# Patient Record
Sex: Female | Born: 1975 | Race: Black or African American | Hispanic: No | Marital: Married | Smoking: Never smoker
Health system: Southern US, Community
[De-identification: ages and names within clinical notes are randomized; demographics above are authoritative.]

## PROBLEM LIST (undated history)

## (undated) DIAGNOSIS — I10 Essential (primary) hypertension: Secondary | ICD-10-CM

## (undated) HISTORY — PX: BREAST REDUCTION SURGERY: SHX8

---

## 2021-02-20 ENCOUNTER — Emergency Department (HOSPITAL_COMMUNITY)
Admission: EM | Admit: 2021-02-20 | Discharge: 2021-02-20 | Disposition: A | Discharge status: Home / Self Care | Payer: BLUE CROSS/BLUE SHIELD | Attending: Emergency Medicine | Admitting: Emergency Medicine

## 2021-02-20 ENCOUNTER — Other Ambulatory Visit: Payer: Self-pay

## 2021-02-20 ENCOUNTER — Encounter (HOSPITAL_COMMUNITY): Payer: Self-pay | Admitting: Emergency Medicine

## 2021-02-20 DIAGNOSIS — I10 Essential (primary) hypertension: Secondary | ICD-10-CM | POA: Insufficient documentation

## 2021-02-20 DIAGNOSIS — R519 Headache, unspecified: Secondary | ICD-10-CM | POA: Diagnosis not present

## 2021-02-20 HISTORY — DX: Essential (primary) hypertension: I10

## 2021-02-20 MED ORDER — KETOROLAC TROMETHAMINE 30 MG/ML IJ SOLN
30.0000 mg | Freq: Once | INTRAMUSCULAR | Status: AC
  Administered 2021-02-20: 30 mg via INTRAVENOUS
  Filled 2021-02-20: qty 1

## 2021-02-20 MED ORDER — PROCHLORPERAZINE EDISYLATE 10 MG/2ML IJ SOLN
10.0000 mg | Freq: Once | INTRAMUSCULAR | Status: AC
  Administered 2021-02-20: 10 mg via INTRAVENOUS
  Filled 2021-02-20: qty 2

## 2021-02-20 MED ORDER — SODIUM CHLORIDE 0.9 % IV BOLUS
500.0000 mL | Freq: Once | INTRAVENOUS | Status: AC
  Administered 2021-02-20: 500 mL via INTRAVENOUS

## 2021-02-20 MED ORDER — DIPHENHYDRAMINE HCL 50 MG/ML IJ SOLN
25.0000 mg | Freq: Once | INTRAMUSCULAR | Status: AC
  Administered 2021-02-20: 25 mg via INTRAVENOUS
  Filled 2021-02-20: qty 1

## 2021-02-20 NOTE — ED Provider Notes (Signed)
  Physical Exam  BP (!) 155/98   Pulse 60   Temp 98.3 F (36.8 C) (Oral)   Resp 20   Ht 5\' 5"  (1.651 m)   Wt 109.8 kg   SpO2 100%   BMI 40.27 kg/m   Physical Exam Vitals reviewed.  Constitutional:      Appearance: Normal appearance.  HENT:     Head: Normocephalic and atraumatic.  Eyes:     General:        Right eye: No discharge.        Left eye: No discharge.     Extraocular Movements: Extraocular movements intact.     Conjunctiva/sclera: Conjunctivae normal.  Pulmonary:     Effort: Pulmonary effort is normal.  Skin:    General: Skin is warm and dry.     Findings: No rash.  Neurological:     General: No focal deficit present.     Mental Status: She is alert.     Comments: Cranial nerves II to XII are intact.  5/5 strength in the upper and lower extremities.  Normal sensation to the upper and lower extremities.  Psychiatric:        Mood and Affect: Mood normal.        Behavior: Behavior normal.    ED Course/Procedures     Procedures  MDM   Accepted handoff at shift change from [facility], PA-C. Please see prior provider note for more detail.   Briefly: Patient is 45 y.o. who presents to the emergency department today for intractable headache.  Recently moved to the area and does not have a primary care provider.  DDX: Given the clinical course, have a low suspicion for acute intracranial pathology at this time.  This is likely a migraine.  She has a normal neurological exam on reevaluation.  Imaging is not warranted at this time.  She feels much better after the headache cocktail. This is not likely due to her HTN. She takes her medications as prescribed and has not missed any doses.   Plan: To discharge home.  I have given her primary care follow-up and will have her follow-up within the next week.  Strict return precautions were given.  Again patient feels much better and is stable for discharge          45 02/20/21 0817     02/22/21, MD 02/20/21 1145

## 2021-02-20 NOTE — Discharge Instructions (Signed)
Thank you for allowing me to care for you today in the Emergency Department.   Please call the number on your discharge paperwork to get established with a primary care provider since he just moved to the area.  Continue to take your home medications as prescribed.  Take 650 mg of Tylenol or 600 mg of ibuprofen with food every 6 hours for pain.  You can alternate between these 2 medications every 3 hours if your pain returns.  For instance, you can take Tylenol at noon, followed by a dose of ibuprofen at 3, followed by second dose of Tylenol and 6.  Make sure that you are drinking plenty of fluids.  Dehydration is the #1 cause of headaches.  Return to the emergency department if you have a severe headache with new numbness, weakness, visual changes, fever, unable to walk, have persistent vomiting, or other new, concerning symptoms.

## 2021-02-20 NOTE — ED Triage Notes (Signed)
Patient arrives complaining of a constant headache since Tuesday. Patient states hx of HTN, taking her medications as prescribed. Patient states no hx of headache, no vision changes, no N/V. Patient states BPs at home have been good.

## 2021-02-20 NOTE — ED Provider Notes (Signed)
[facility]-EMERGENCY DEPT Provider Note   CSN: [id number] Arrival date & time: 02/20/21  0503     History Chief Complaint  Patient presents with   Headache    Gina Wu is a 45 y.o. female with a history of hypertension who presents to the emergency department with a chief complaint of headache.  The patient reports a constant headache for the last 5 days.  Head is located in her mid-forehead and parietal scalp.  Pain is not radiating.  She characterizes the pain as throbbing.  Pain is worse if she bends over, but no other known aggravating or alleviating factors.  Pain has been waxing and waning in intensity since onset.  She does report a history of intermittent headaches over the years, but does state that this is different from previous headaches as it has lasted longer and has been more intense.  She denies fever, chills, neck pain or stiffness, diplopia, blurred vision, amaurosis fugax, nausea, vomiting, chest pain, shortness of breath, numbness, weakness, rash, dizziness, cough, lightheadedness, or URI symptoms.  She reports compliance with her home antihypertensives.  The patient just recently relocated to the area from [place].  No known sick contacts.  The history is provided by the patient and medical records. No language interpreter was used.      Past Medical History:  Diagnosis Date   Hypertension     There are no problems to display for this patient.   History reviewed. No pertinent surgical history.   OB History   No obstetric history on file.     No family history on file.  Social History   Tobacco Use   Smoking status: Never   Smokeless tobacco: Never  Substance Use Topics   Alcohol use: Not Currently   Drug use: Never    Home Medications Prior to Admission medications   Not on File    Allergies    Patient has no known allergies.  Review of Systems   Review of Systems  Constitutional:  Negative for  activity change, chills, fatigue and fever.  HENT:  Negative for congestion, sneezing, sore throat and voice change.   Eyes:  Negative for visual disturbance.  Respiratory:  Negative for cough, shortness of breath and wheezing.   Cardiovascular:  Negative for chest pain and palpitations.  Gastrointestinal:  Negative for abdominal pain, constipation, diarrhea, nausea and vomiting.  Genitourinary:  Negative for dysuria.  Musculoskeletal:  Negative for arthralgias, back pain, myalgias and neck stiffness.  Skin:  Negative for rash.  Allergic/Immunologic: Negative for immunocompromised state.  Neurological:  Positive for headaches. Negative for dizziness, seizures, syncope, weakness and numbness.  Psychiatric/Behavioral:  Negative for confusion.    Physical Exam Updated Vital Signs BP (!) 155/98   Pulse 60   Temp 98.3 F (36.8 C) (Oral)   Resp 20   Ht 5\' 5"  (1.651 m)   Wt 109.8 kg   SpO2 100%   BMI 40.27 kg/m   Physical Exam Vitals and nursing note reviewed.  Constitutional:      General: She is not in acute distress.    Appearance: She is not ill-appearing, toxic-appearing or diaphoretic.  HENT:     Head: Normocephalic.  Eyes:     Conjunctiva/sclera: Conjunctivae normal.  Cardiovascular:     Rate and Rhythm: Normal rate and regular rhythm.     Pulses: Normal pulses.     Heart sounds: Normal heart sounds. No murmur heard.   No friction rub. No gallop.  Pulmonary:     Effort: Pulmonary effort is normal. No respiratory distress.     Breath sounds: No stridor. No wheezing, rhonchi or rales.  Chest:     Chest wall: No tenderness.  Abdominal:     General: There is no distension.     Palpations: Abdomen is soft. There is no mass.     Tenderness: There is no abdominal tenderness. There is no right CVA tenderness, left CVA tenderness, guarding or rebound.     Hernia: No hernia is present.  Musculoskeletal:        General: No tenderness.     Cervical back: Neck supple.      Right lower leg: No edema.     Left lower leg: No edema.  Skin:    General: Skin is warm.     Capillary Refill: Capillary refill takes less than 2 seconds.     Findings: No rash.  Neurological:     Mental Status: She is alert.     Comments: Mental Status: Patient is awake, alert, oriented to person, place, month, year, and situation. Patient is able to give a clear and coherent history. Cranial Nerves: II: Visual Fields are full. Pupils are equal, round, and reactive to light. III,IV, VI: EOMI without ptosis or diploplia.  V: Facial sensation is symmetric to temperature VII: Facial movement is symmetric.  VIII: hearing is intact to voice X: Uvula elevates symmetrically XI: Shoulder shrug is symmetric. XII: tongue is midline without atrophy or fasciculations.  Motor: Tone is normal. Bulk is normal. 5/5 strength was present in all four extremities.  Sensory: Sensation is symmetric to light touch and temperature in the arms and legs. Cerebellar: FNF intact bilaterally    Psychiatric:        Behavior: Behavior normal.    ED Results / Procedures / Treatments   Labs (all labs ordered are listed, but only abnormal results are displayed) Labs Reviewed - No data to display  EKG None  Radiology No results found.  Procedures Procedures   Medications Ordered in ED Medications  prochlorperazine (COMPAZINE) injection 10 mg (has no administration in time range)  diphenhydrAMINE (BENADRYL) injection 25 mg (has no administration in time range)  sodium chloride 0.9 % bolus 500 mL (has no administration in time range)  ketorolac (TORADOL) 30 MG/ML injection 30 mg (has no administration in time range)    ED Course  I have reviewed the triage vital signs and the nursing notes.  Pertinent labs & imaging results that were available during my care of the patient were reviewed by me and considered in my medical decision making (see chart for details).    MDM Rules/Calculators/A&P                            45 year old female with a history of hypertension who presents the emergency department with a chief complaint of headache.  Patient has had a persistent headache for the last 5 days.  No other associated symptoms.  She has been compliant with her home antihypertensive agents.  Mild hypertension in the ED.  Vital signs are otherwise stable.  She has no focal neurologic deficits.  Low suspicion for [facility], [facility], meningitis, or temporal arteritis.  Patient care transferred to [facility] at the end of my shift. Patient presentation, ED course, and plan of care discussed with review of all pertinent labs and imaging. Please see his/her note for further details regarding further ED  course and disposition.  Final Clinical Impression(s) / ED Diagnoses Final diagnoses:  Bad headache    Rx / DC Orders ED Discharge Orders     None        Barkley Boards, PA-C 02/20/21 0658    Koleen Distance, MD 02/20/21 [id number]

## 2021-03-31 ENCOUNTER — Other Ambulatory Visit: Payer: Self-pay | Admitting: Family Medicine

## 2021-03-31 DIAGNOSIS — H4710 Unspecified papilledema: Secondary | ICD-10-CM

## 2021-03-31 DIAGNOSIS — G4452 New daily persistent headache (NDPH): Secondary | ICD-10-CM

## 2021-04-10 ENCOUNTER — Ambulatory Visit
Admission: RE | Admit: 2021-04-10 | Discharge: 2021-04-10 | Disposition: A | Discharge status: Home / Self Care | Payer: Self-pay | Source: Ambulatory Visit | Attending: Family Medicine | Admitting: Family Medicine

## 2021-04-10 DIAGNOSIS — G4452 New daily persistent headache (NDPH): Secondary | ICD-10-CM

## 2021-04-10 DIAGNOSIS — H4710 Unspecified papilledema: Secondary | ICD-10-CM

## 2021-04-10 IMAGING — MR MR HEAD W/O CM
11 series · 48 of 48 positions shown · non-contrast
Comparison: No pertinent prior exams available for comparison.

CLINICAL DATA: New daily persistent headache. Optic nerve swelling.
Additional history provided by scanning technologist: Patient
reports severe headaches for 2 months.

EXAM:
MRI HEAD WITHOUT CONTRAST
TECHNIQUE: Multiplanar, multiecho pulse sequences of the brain and surrounding
structures were obtained without intravenous contrast.

[Series 5: T1 · sagittal · 4.0mm · 0.75mm/px · 2 of 31 slices shown (1 of 2)]
[im 1/31]
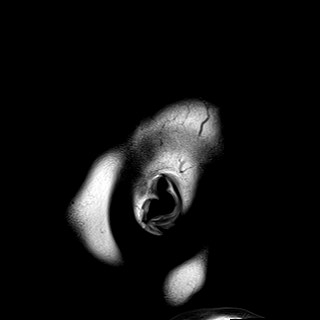
[im 31/31]
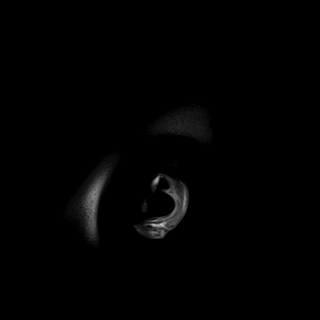

[Series 6: DWI · axial · 3.0mm · 0.94mm/px · z∈[-84,+56]mm · 10 of 160 slices shown (1 of 3)]
[im 1/160]
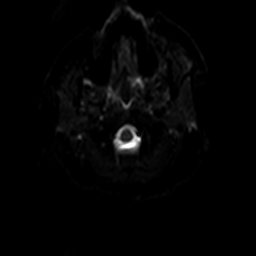
[im 18/160]
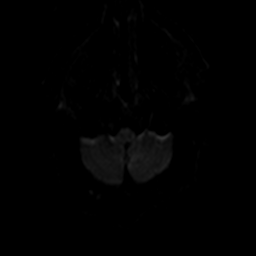
[im 36/160]
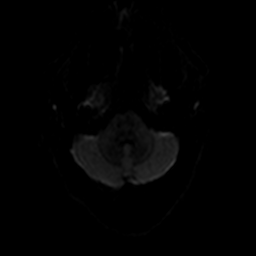
[im 54/160]
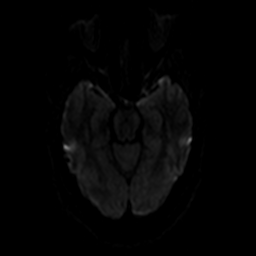
[im 71/160]
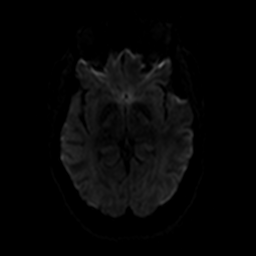
[im 89/160]
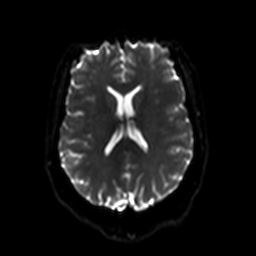
[im 107/160]
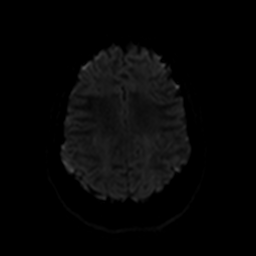
[im 124/160]
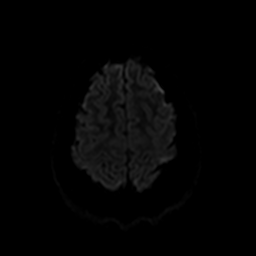
[im 142/160]
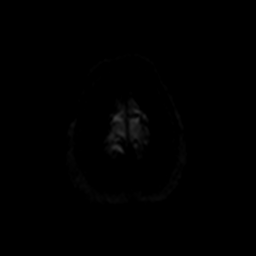
[im 160/160]
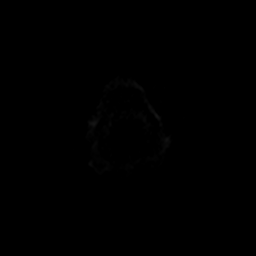

[Series 7: ax dwi_tracew · axial · 3.0mm · 0.94mm/px · z∈[-84,+56]mm · 5 of 80 slices shown]
[im 1/80]
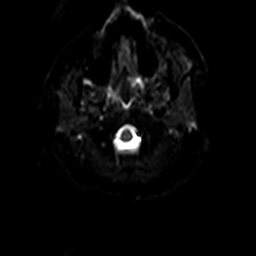
[im 20/80]
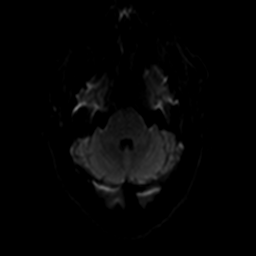
[im 40/80]
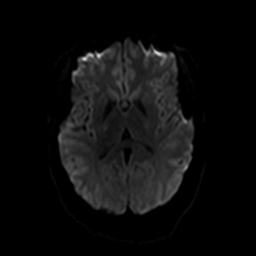
[im 60/80]
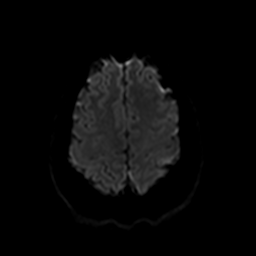
[im 80/80]
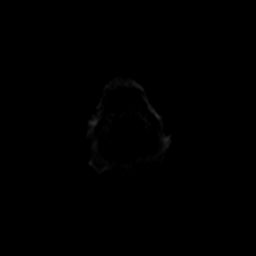

[Series 8: ax dwi_adc · axial · 3.0mm · 0.94mm/px · z∈[-84,+56]mm · 3 of 40 slices shown]
[im 1/40]
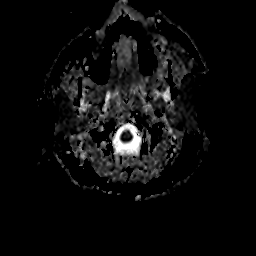
[im 20/40]
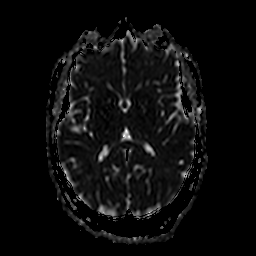
[im 40/40]
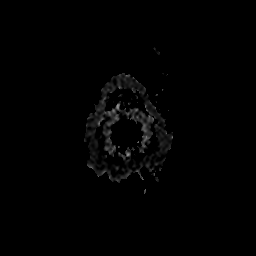

[Series 9: DWI · coronal · 5.0mm · 1.44mm/px · 4 of 60 slices shown (2 of 3)]
[im 1/60]
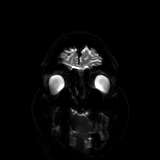
[im 20/60]
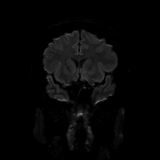
[im 40/60]
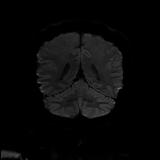
[im 60/60]
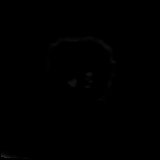

[Series 10: DWI · coronal · 5.0mm · 1.44mm/px · 2 of 30 slices shown (3 of 3)]
[im 1/30]
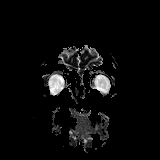
[im 30/30]
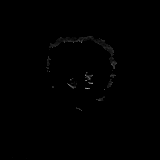

[Series 11: T2 · axial · 4.0mm · 0.36mm/px · z∈[-84,+61]mm · 2 of 29 slices shown (1 of 2)]
[im 1/29]
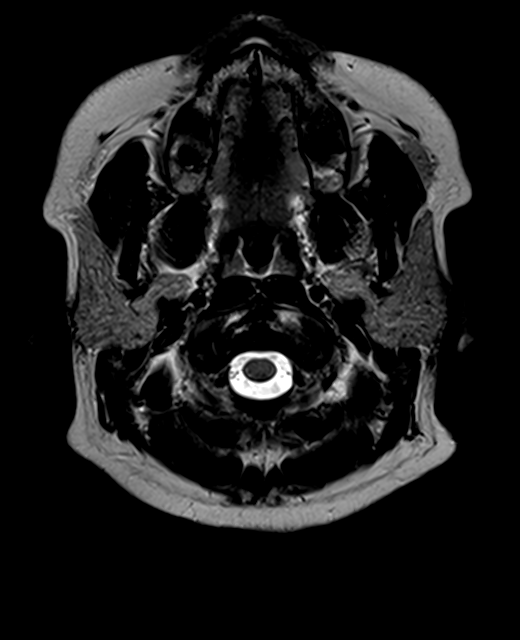
[im 29/29]
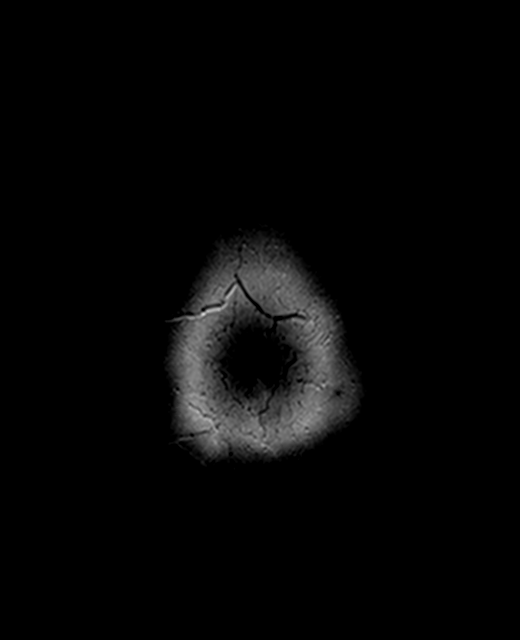

[Series 12: FLAIR · axial · 3.0mm · 0.72mm/px · z∈[-88,+61]mm · 2 of 26 slices shown]
[im 1/26]
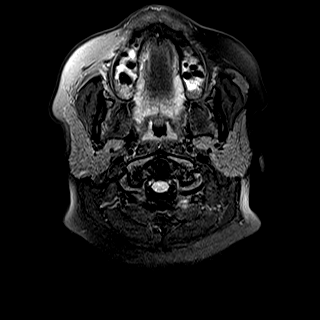
[im 26/26]
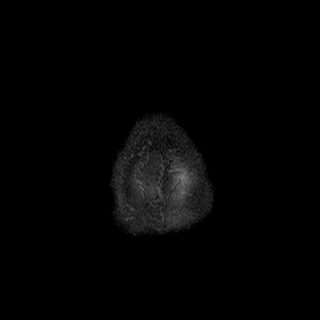

[Series 13: swi_images · axial · 1.5mm · 0.90mm/px · z∈[-82,+60]mm · 6 of 96 slices shown]
[im 1/96]
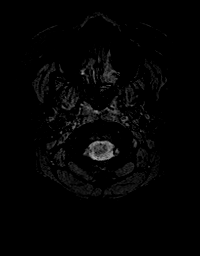
[im 20/96]
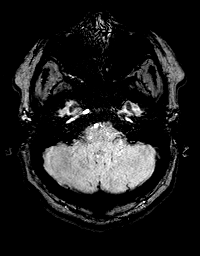
[im 39/96]
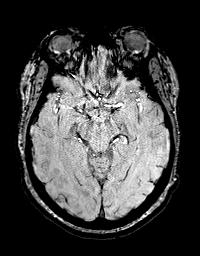
[im 58/96]
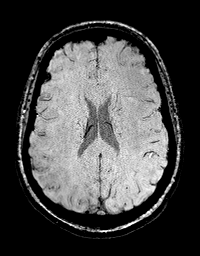
[im 77/96]
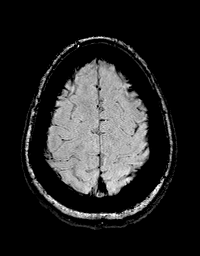
[im 96/96]
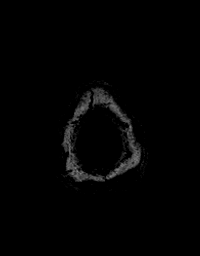

[Series 15: T1 · axial · 1.0mm · 0.94mm/px · z∈[-91,+68]mm · 10 of 160 slices shown (2 of 2)]
[im 1/160]
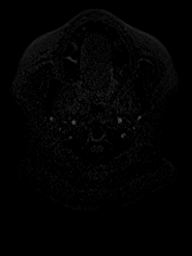
[im 18/160]
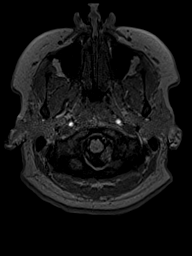
[im 36/160]
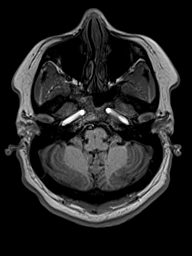
[im 54/160]
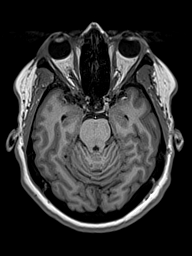
[im 71/160]
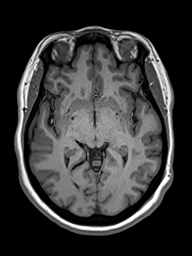
[im 89/160]
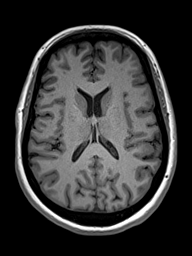
[im 107/160]
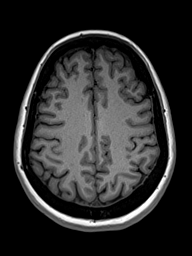
[im 124/160]
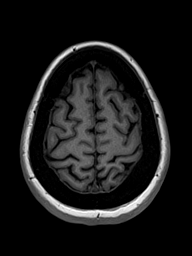
[im 142/160]
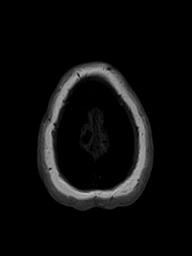
[im 160/160]
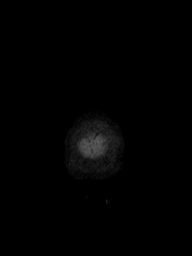

[Series 16: T2 · coronal · 4.5mm · 0.36mm/px · 2 of 30 slices shown (2 of 2)]
[im 1/30]
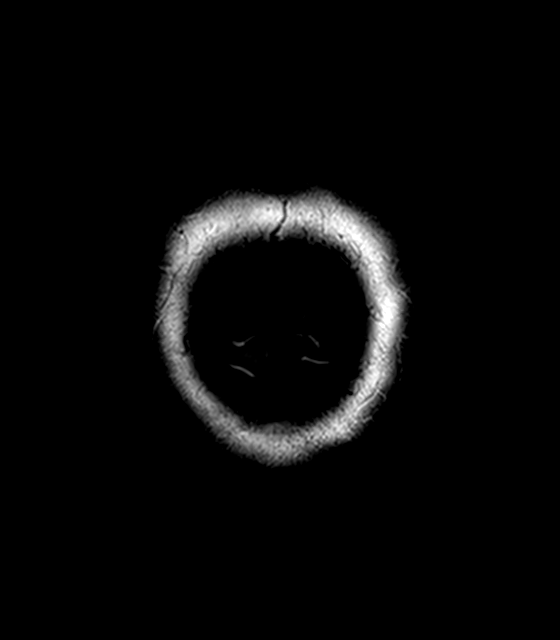
[im 30/30]
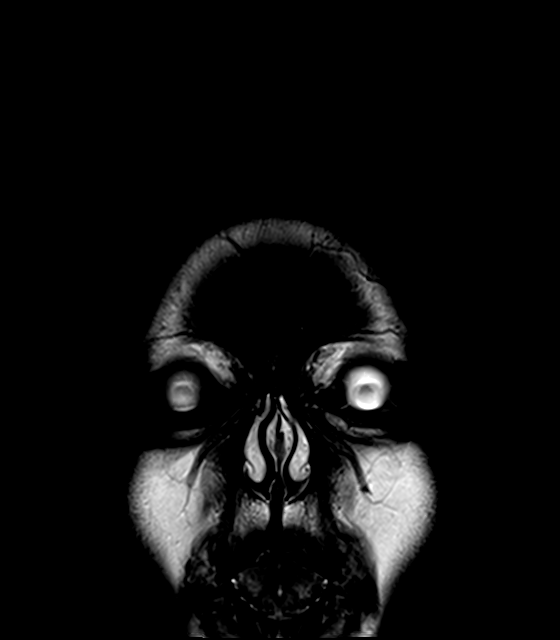

[48 of 48 positions shown; findings below may reference images not displayed]

FINDINGS: Brain:

Cerebral volume is normal.

Multifocal T2 FLAIR hyperintense signal abnormality within the
cerebral white matter, overall mild but advanced for age.

Partially empty and slightly expanded sella turcica.

There is no acute infarct.

No evidence of an intracranial mass.

No chronic intracranial blood products.

No extra-axial fluid collection.

No midline shift.

Vascular: Maintained flow voids within the proximal large arterial
vessels.

Skull and upper cervical spine: No focal suspicious marrow lesion.

Sinuses/Orbits: Visualized orbits show no acute finding. Trace
mucosal thickening within the bilateral ethmoid air cells.
IMPRESSION: Partially empty and slightly expanded sella turcica. While this
finding can reflect incidental anatomic variation, it can also be
associated with idiopathic intracranial hypertension (pseudotumor
cerebri). This diagnosis should be considered given the provided
history.

Multifocal T2 FLAIR hyperintense signal abnormality within the
cerebral white matter, overall mild but advanced for age. These
signal changes are nonspecific and differential considerations
include chronic small vessel ischemic disease, sequela of chronic
migraine headaches, sequela of a prior infectious/inflammatory
process or sequela of a demyelinating process (such as multiple
sclerosis), among others.

Otherwise unremarkable non-contrast MRI appearance of the brain.

Minimal bilateral ethmoid sinus mucosal thickening.

## 2021-05-12 ENCOUNTER — Ambulatory Visit: Payer: BLUE CROSS/BLUE SHIELD | Admitting: Psychiatry

## 2021-05-12 NOTE — Progress Notes (Signed)
Referring:  Ernesto Rutherford, MD [address] [place],  [place] [address]  PCP: Aliene Beams, MD  Neurology was asked to evaluate Gina Wu, a 45 year old female for a chief complaint of headaches.  Our recommendations of care will be communicated by shared medical record.    CC:  headaches  HPI:  Medical co-morbidities: HTN  The patient presents for evaluation of worsening headaches over the past few months. Initially thought this was stress related, but her stress decreased and headaches persisted. Headaches are daily, will remit with tylenol but then return. Denies vision changes or positional component to her headache. She does endorse pulsatile tinnitus.  Saw Dr. Dione Booze in Ophthalmology 11/1 who noted papilledema. Had an MRI brain done 04/10/21 which showed a partially empty sella.  Headache History: Onset: 2 months ago Triggers: stress Aura: no Location: occipital Quality/Description: pressure, squeezing Associated Symptoms:  Photophobia: no  Phonophobia: no  Nausea: no Worse with activity?: no Duration of headaches: constant  Pregnancy planning/birth control: none  Headache days per month: 30 Headache free days per month: 0  Current Treatment: Abortive tylenol  Preventative none  Prior Therapies                                 tylenol   LABS: N/a  IMAGING:  MRI brain 04/10/21: partially empty sella  Imaging independently reviewed on May 13, 2021   Current Outpatient Medications on File Prior to Visit  Medication Sig Dispense Refill   hydrochlorothiazide (HYDRODIURIL) 25 MG tablet Take 25 mg by mouth daily.     losartan (COZAAR) 100 MG tablet Take 100 mg by mouth daily.     No current facility-administered medications on file prior to visit.     Allergies: No Known Allergies  Family History: Migraine or other headaches in the family:  no Aneurysms in a first degree relative:  no Brain tumors in the family:  no Other  neurological illness in the family:   no  Past Medical History: Past Medical History:  Diagnosis Date   Hypertension     Past Surgical History Past Surgical History:  Procedure Laterality Date   BREAST REDUCTION SURGERY      Social History: Social History   Tobacco Use   Smoking status: Never   Smokeless tobacco: Never  Substance Use Topics   Alcohol use: Not Currently   Drug use: Never    ROS: Negative for fevers, chills. Positive for headaches, pulsatile tinnitus. All other systems reviewed and negative unless stated otherwise in HPI.   Physical Exam:   Vital Signs: BP 140/82   Pulse (!) 58   Ht 5\' 5"  (1.651 m)   Wt 251 lb (113.9 kg)   SpO2 95%   BMI 41.77 kg/m  GENERAL: well appearing,in no acute distress,alert SKIN:  Color, texture, turgor normal. No rashes or lesions HEAD:  Normocephalic/atraumatic. CV:  RRR RESP: Normal respiratory effort MSK: no tenderness to palpation over occiput, neck, or shoulders  NEUROLOGICAL: Mental Status: Alert, oriented to person, place and time,Follows commands Cranial Nerves: PERRL,optic disc margins blurred OU, visual fields intact to confrontation,extraocular movements intact,facial sensation intact,no facial droop or ptosis,hearing intact to finger rub bilaterally,no dysarthria Motor: muscle strength 5/5 both upper and lower extremities,no drift, normal tone Reflexes: 2+ throughout Sensation: intact to light touch all 4 extremities Coordination: Finger-to- nose-finger intact bilaterally Gait: normal-based   IMPRESSION: 45 year old female with  a history of HTN who presents for evaluation of worsening headaches and papilledema. MRI brain showed a partially empty sella. Will check CTV for cerebral sinus venous thrombosis. If this is negative will plan for LP to assess for IIH.   PLAN: -CT venogram -If CTV normal will plan for LP following imaging  I spent a total of 26 minutes chart reviewing and counseling the  patient. Headache education was done. Discussed workup and differential diagnoses of increased intracranial pressure. Written educational materials and patient instructions outlining all of the above were given.  Follow-up: 3 months   Ocie Doyne, MD 05/13/2021   8:46 AM

## 2021-05-13 ENCOUNTER — Encounter: Payer: Self-pay | Admitting: Psychiatry

## 2021-05-13 ENCOUNTER — Ambulatory Visit (INDEPENDENT_AMBULATORY_CARE_PROVIDER_SITE_OTHER): Payer: No Typology Code available for payment source | Admitting: Psychiatry

## 2021-05-13 VITALS — BP 140/82 | HR 58 | Ht 65.0 in | Wt 251.0 lb

## 2021-05-13 DIAGNOSIS — G4489 Other headache syndrome: Secondary | ICD-10-CM | POA: Diagnosis not present

## 2021-05-13 DIAGNOSIS — I10 Essential (primary) hypertension: Secondary | ICD-10-CM | POA: Diagnosis not present

## 2021-05-13 DIAGNOSIS — H4710 Unspecified papilledema: Secondary | ICD-10-CM

## 2021-05-13 NOTE — Patient Instructions (Signed)
There is concern that you may have a condition called IIH (idiopathic intracranial hypertension), which is a condition where your body produces too much spinal fluid and this causes increased pressure your brain. We will get imaging of the veins in your brain to make sure there is no blood clot. If this is negative, the next step will be a spinal tap to look at the pressure of your spinal fluid. If the pressure is high we will start you on a medication to help bring the pressure back down.

## 2021-05-26 ENCOUNTER — Telehealth: Payer: Self-pay | Admitting: Psychiatry

## 2021-05-26 NOTE — Telephone Encounter (Signed)
[place] pending faxed notes

## 2021-05-27 NOTE — Telephone Encounter (Signed)
[employer]: [id number] (exp. 05/26/21 to 07/10/21) order sent to GI, they will reach out to the patient to schedule.

## 2021-07-01 ENCOUNTER — Ambulatory Visit
Admission: RE | Admit: 2021-07-01 | Discharge: 2021-07-01 | Disposition: A | Discharge status: Home / Self Care | Payer: Self-pay | Source: Ambulatory Visit | Attending: Sports Medicine | Admitting: Sports Medicine

## 2021-07-01 ENCOUNTER — Other Ambulatory Visit: Payer: Self-pay

## 2021-07-01 ENCOUNTER — Other Ambulatory Visit: Payer: Self-pay | Admitting: Sports Medicine

## 2021-07-01 DIAGNOSIS — M545 Low back pain, unspecified: Secondary | ICD-10-CM
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
## 2021-07-30 ENCOUNTER — Other Ambulatory Visit: Payer: Self-pay | Admitting: Sports Medicine

## 2021-07-30 DIAGNOSIS — M545 Low back pain, unspecified: Secondary | ICD-10-CM

## 2021-08-14 ENCOUNTER — Other Ambulatory Visit: Payer: Self-pay

## 2021-08-14 ENCOUNTER — Ambulatory Visit
Admission: RE | Admit: 2021-08-14 | Discharge: 2021-08-14 | Disposition: A | Discharge status: Home / Self Care | Payer: Self-pay | Source: Ambulatory Visit | Attending: Sports Medicine | Admitting: Sports Medicine

## 2021-08-14 DIAGNOSIS — M545 Low back pain, unspecified: Secondary | ICD-10-CM

## 2021-08-14 IMAGING — MR MR LUMBAR SPINE W/O CM
4 of 6 series · 25 of 48 positions shown · non-contrast
Comparison: Lumbar spine radiograph [DATE]

CLINICAL DATA: Low back pain x 5+ yrs;

EXAM:
MRI LUMBAR SPINE WITHOUT CONTRAST
TECHNIQUE: Multiplanar, multisequence MR imaging of the lumbar spine was
performed. No intravenous contrast was administered.

[Series 7: T1 · sagittal · 4.0mm · 0.88mm/px · 5 of 15 slices shown (1 of 2)]
[im 1/15]
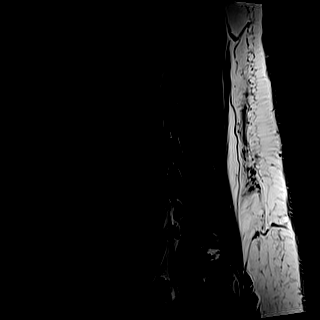
[im 4/15]
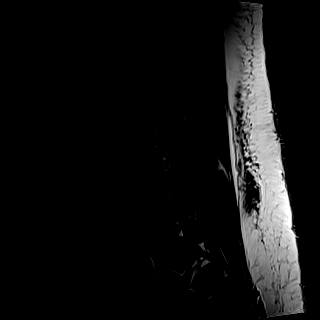
[im 8/15]
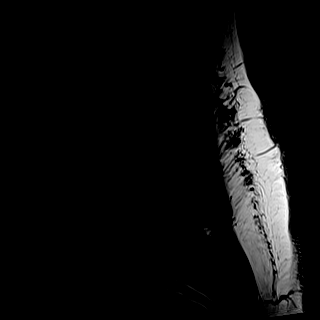
[im 11/15]
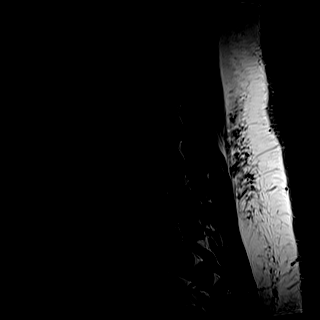
[im 15/15]
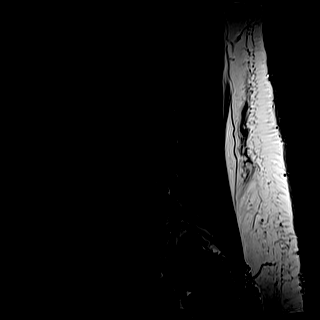

[Series 8: T2 · sagittal · 4.0mm · 0.88mm/px · 5 of 15 slices shown (1 of 2)]
[im 1/15]
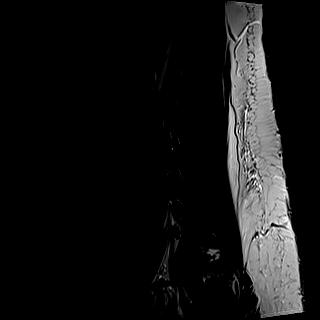
[im 4/15]
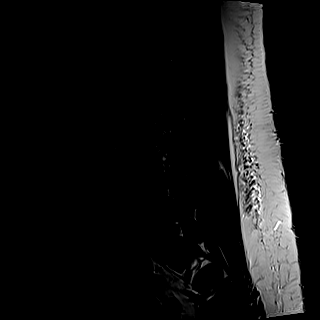
[im 8/15]
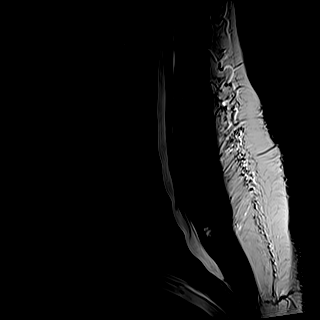
[im 11/15]
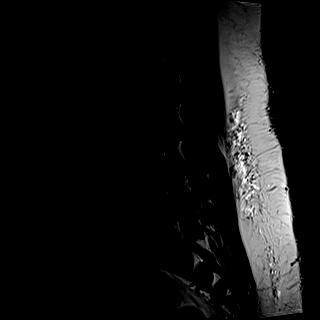
[im 15/15]
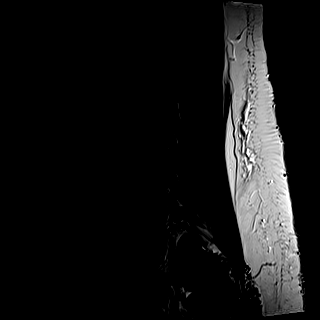

[Series 11: T1 · axial · 4.0mm · 0.35mm/px · z∈[-31,+147]mm · 7 of 41 slices shown (2 of 2)]
[im 1/41]
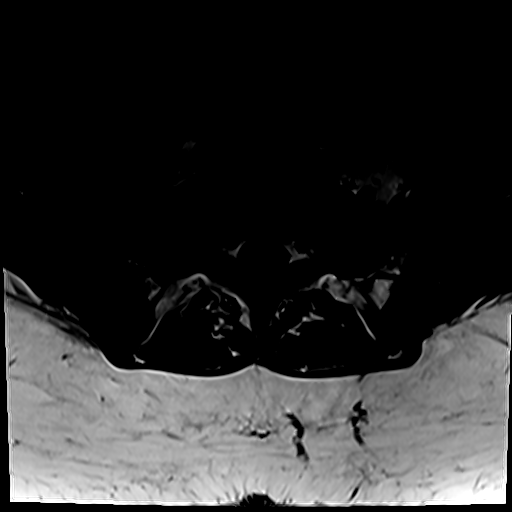
[im 7/41]
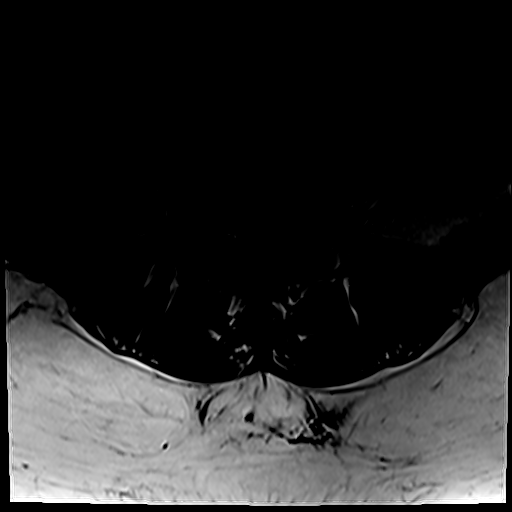
[im 13/41]
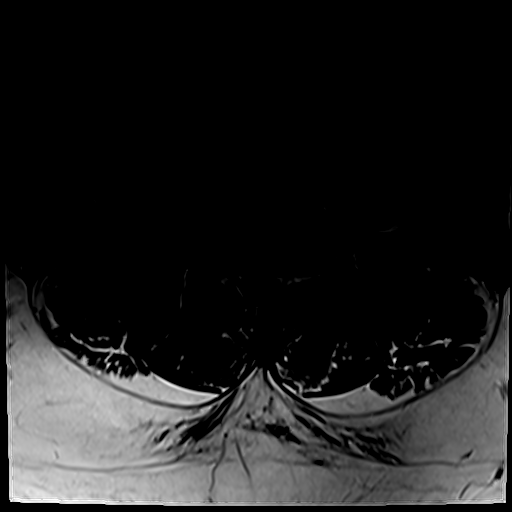
[im 19/41]
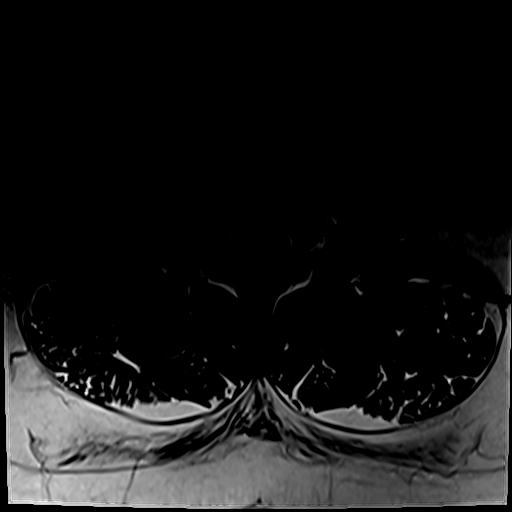
[im 22/41]
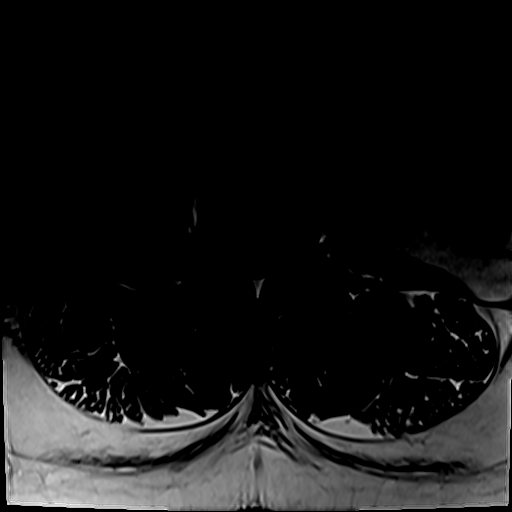
[im 28/41]
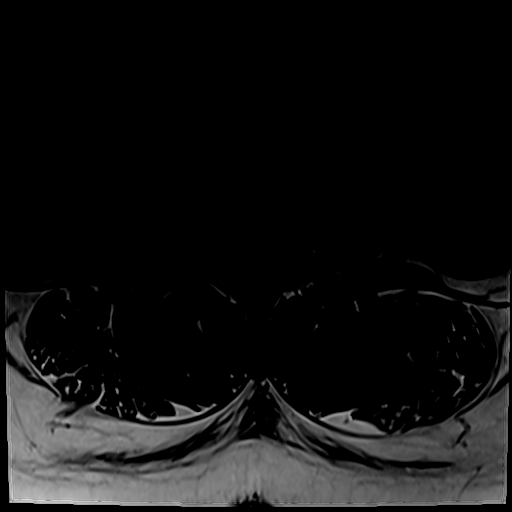
[im 34/41]
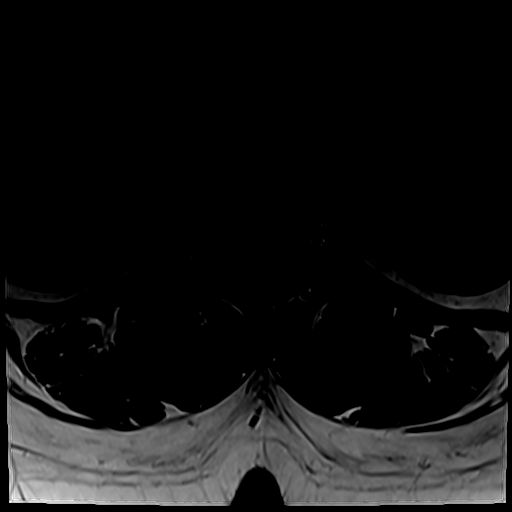

[Series 14: T2 · axial · 4.0mm · 0.35mm/px · z∈[-31,+182]mm · 8 of 41 slices shown (2 of 2)]
[im 1/41]
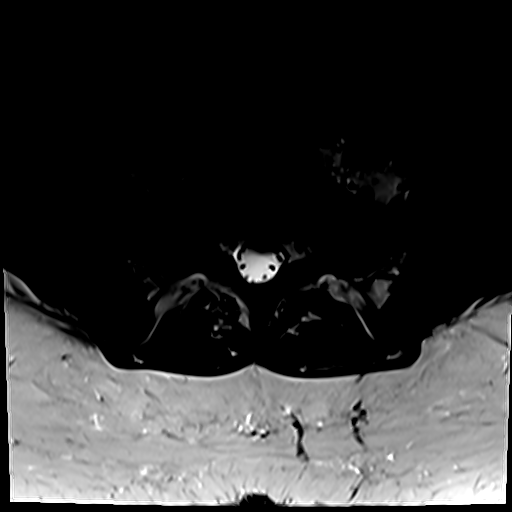
[im 7/41]
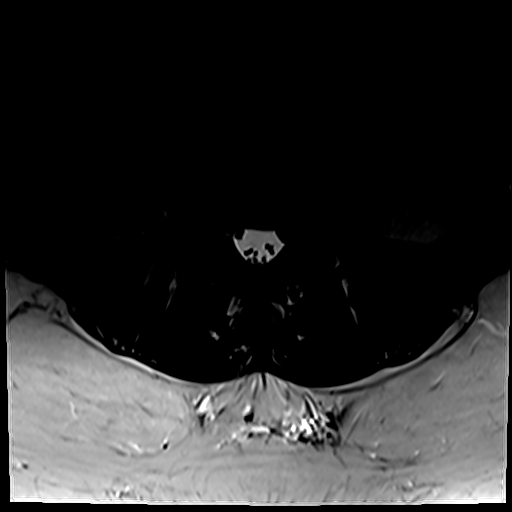
[im 13/41]
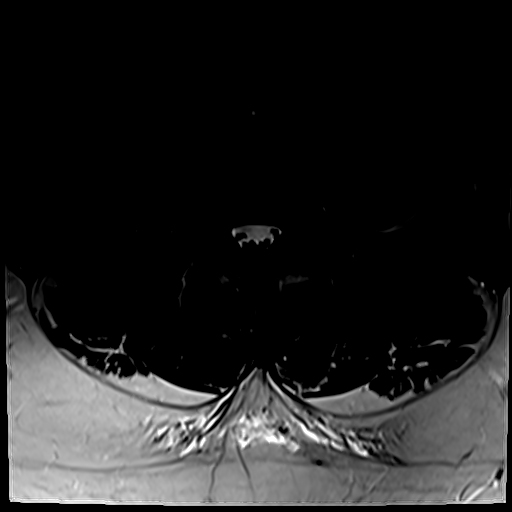
[im 19/41]
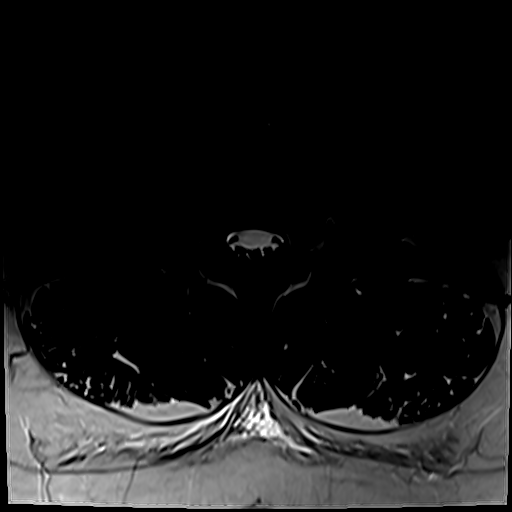
[im 22/41]
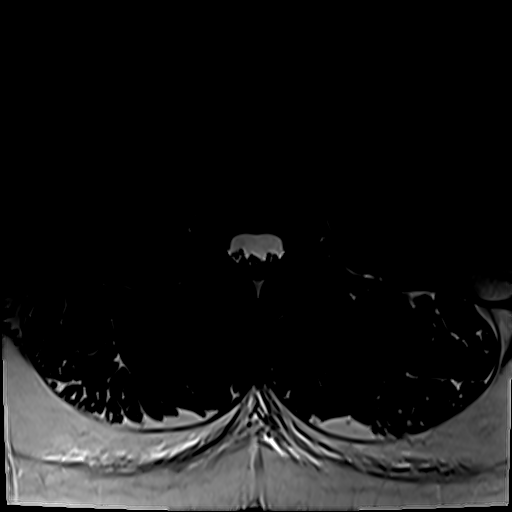
[im 28/41]
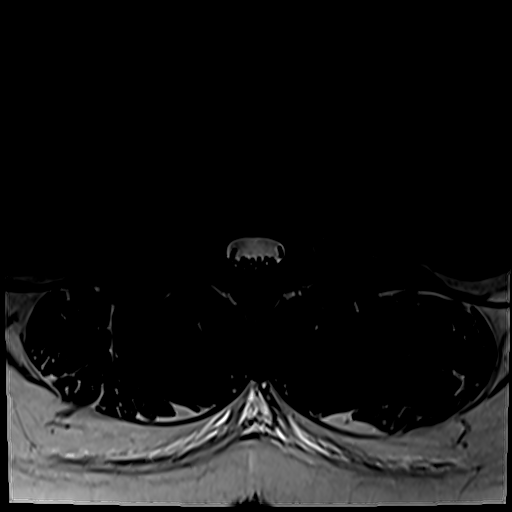
[im 34/41]
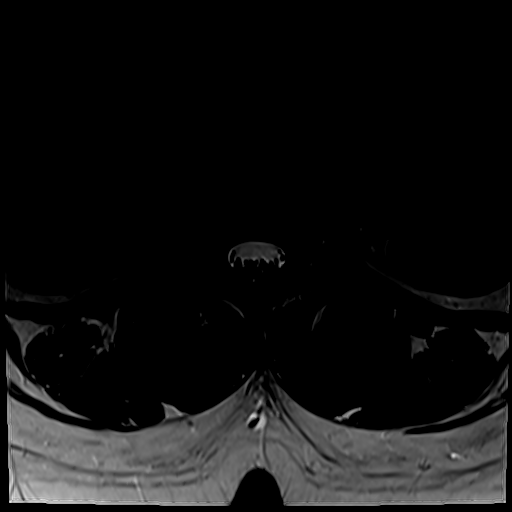
[im 41/41]
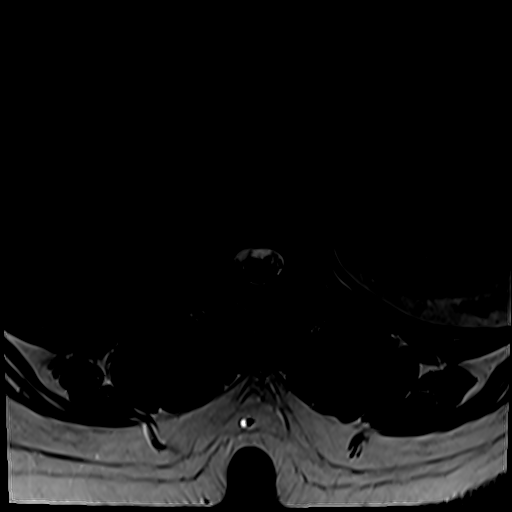

[25 of 48 positions shown; findings below may reference images not displayed]

FINDINGS: Segmentation:  Standard.

Alignment:  Physiologic.

Vertebrae:  No fracture, evidence of discitis, or bone lesion.

Conus medullaris and cauda equina: Conus extends to the L1 level.
Conus and cauda equina appear normal.

Paraspinal and other soft tissues: Negative.

Disc levels:

The T11-L4 disc levels are normal.

L4-5: Mild disc bulge without spinal canal or neural foraminal
stenosis. There is an annular fissure in the right extraforaminal
zone, near the exiting right L4 nerve root.

L5-S1: Minimal disc bulge without spinal canal or neural foraminal
stenosis.
IMPRESSION: 1. Mild disc bulges at L4-5 and L5-S1 without spinal canal or neural
foraminal stenosis.
2. Right extraforaminal annular fissure at L4-L5, near the exiting
right L4 nerve root. This could serve as a source of right L4
radiculopathy.

## 2021-08-24 ENCOUNTER — Ambulatory Visit (INDEPENDENT_AMBULATORY_CARE_PROVIDER_SITE_OTHER): Payer: No Typology Code available for payment source | Admitting: Psychiatry

## 2021-08-24 ENCOUNTER — Encounter: Payer: Self-pay | Admitting: Psychiatry

## 2021-08-24 ENCOUNTER — Telehealth: Payer: Self-pay | Admitting: Psychiatry

## 2021-08-24 VITALS — BP 169/113 | HR 67 | Ht 65.0 in | Wt 252.0 lb

## 2021-08-24 DIAGNOSIS — H4710 Unspecified papilledema: Secondary | ICD-10-CM | POA: Diagnosis not present

## 2021-08-24 MED ORDER — ACETAZOLAMIDE 250 MG PO TABS: ORAL_TABLET | ORAL | 3 refills | Status: DC

## 2021-08-24 NOTE — Telephone Encounter (Signed)
[employer] pending uploaded notes on the portal- i marked it urgent  ?

## 2021-08-24 NOTE — Telephone Encounter (Signed)
[employer]: [id number] (exp. 08/24/21 to 10/07/21) sent Pieter Partridge and Rinaldo Cloud a message with GI to schedule the patient as soon as possible.  ?

## 2021-08-24 NOTE — Patient Instructions (Signed)
CT of the veins in the brain ?2. Start diamox (acetazolamide) 250 mg twice a day for one week, then increase to 250 in the morning and 500 mg at night for one week, then increase to 500 mg twice a day ?

## 2021-08-24 NOTE — Progress Notes (Signed)
? ?  CC:  headaches ? ?Follow-up Visit ? ?Last visit: 05/13/21 ? ?Brief HPI: ?46 year old female with a history of HTN who follows in clinic for headaches and papilledema. ? ?At her last visit, CTV was ordered with plan to do LP if CT was normal. ? ?Interval History: ?Headaches improved after her last appointment, then started to reoccur over the past week. They have been constant for the past 10 days. Headaches are described as occipital pressure. They are not positional. She has been taking Tylenol which helps. She will occasionally see a cloud in her right eye. Continues to have pulsatile tinnitus. ? ?CTV was never done. States she never received a call to schedule this. ? ?Headache days per month: 10 ?Headache free days per month: 20 ? ?Current Headache Regimen: ?Preventative: none ?Abortive: Tylenol ? ?Prior Therapies                                  ?Tylenol ? ?Physical Exam:  ? ?Vital Signs: ?BP (!) 169/113   Pulse 67   Ht 5\' 5"  (1.651 m)   Wt 252 lb (114.3 kg)   BMI 41.93 kg/m?  ?GENERAL:  well appearing, in no acute distress, alert  ?SKIN:  Color, texture, turgor normal. No rashes or lesions ?HEAD:  Normocephalic/atraumatic. ?RESP: normal respiratory effort ?MSK:  No gross joint deformities.  ? ?NEUROLOGICAL: ?Mental Status: Alert, oriented to person, place and time, Follows commands, and Speech fluent and appropriate. ?Cranial Nerves: PERRL, papilledema present OU, face symmetric, no dysarthria, hearing grossly intact ?Motor: moves all extremities equally ?Gait: normal-based. ? ?IMPRESSION: ?46 year old female who presents for follow up of headaches and papilledema. CTV was never done as she did not receive a call to schedule this. Will work on getting this scheduled ASAP and likely will need LP afterwards. Will start Diamox today. Counseled her to go to the ED for expedited workup if she experience significant vision changes or headaches continue to worsen. ? ?PLAN: ?-Start Diamox 250 mg BID, increase  by 250 mg each week up to 500 mg BID ?-CTV stat ?-Likely will need LP following CTV ?-Counseled to go to ED if vision worsens ? ? ?Follow-up: 4 months ? ?I spent a total of 21 minutes on the date of the service. Headache education was done. Discussed medication side effects, adverse reactions and drug interactions. Written educational materials and patient instructions outlining all of the above were given. ? ?52, MD ?08/24/21 ?9:19 AM ? ?

## 2021-08-25 ENCOUNTER — Other Ambulatory Visit: Payer: No Typology Code available for payment source

## 2021-08-30 ENCOUNTER — Emergency Department (HOSPITAL_COMMUNITY): Payer: BLUE CROSS/BLUE SHIELD

## 2021-08-30 ENCOUNTER — Telehealth: Payer: Self-pay | Admitting: Psychiatry

## 2021-08-30 ENCOUNTER — Other Ambulatory Visit: Payer: Self-pay

## 2021-08-30 ENCOUNTER — Emergency Department (HOSPITAL_COMMUNITY)
Admission: EM | Admit: 2021-08-30 | Discharge: 2021-08-30 | Disposition: A | Discharge status: Home / Self Care | Payer: BLUE CROSS/BLUE SHIELD | Attending: Emergency Medicine | Admitting: Emergency Medicine

## 2021-08-30 ENCOUNTER — Encounter (HOSPITAL_COMMUNITY): Payer: Self-pay | Admitting: Emergency Medicine

## 2021-08-30 DIAGNOSIS — H538 Other visual disturbances: Secondary | ICD-10-CM | POA: Diagnosis not present

## 2021-08-30 DIAGNOSIS — R519 Headache, unspecified: Secondary | ICD-10-CM | POA: Diagnosis present

## 2021-08-30 LAB — BASIC METABOLIC PANEL
Anion gap: 11 (ref 5–15)
BUN: 17 mg/dL (ref 6–20)
CO2: 18 mmol/L — ABNORMAL LOW (ref 22–32)
Calcium: 9.1 mg/dL (ref 8.9–10.3)
Chloride: 107 mmol/L (ref 98–111)
Creatinine, Ser: 0.99 mg/dL (ref 0.44–1.00)
GFR, Estimated: >60 mL/min (ref >60)
Glucose, Bld: 95 mg/dL (ref 70–99)
Potassium: 3.6 mmol/L (ref 3.5–5.1)
Sodium: 136 mmol/L (ref 135–145)

## 2021-08-30 LAB — CBC WITH DIFFERENTIAL/PLATELET
Abs Immature Granulocytes: 0.04 10*3/uL (ref 0.00–0.07)
Basophils Absolute: 0 10*3/uL (ref 0.0–0.1)
Basophils Relative: 1 %
Eosinophils Absolute: 0.1 10*3/uL (ref 0.0–0.5)
Eosinophils Relative: 1 %
HCT: 41.4 % (ref 36.0–46.0)
Hemoglobin: 12.8 g/dL (ref 12.0–15.0)
Immature Granulocytes: 1 %
Lymphocytes Relative: 35 %
Lymphs Abs: 2.5 10*3/uL (ref 0.7–4.0)
MCH: 24.9 pg — ABNORMAL LOW (ref 26.0–34.0)
MCHC: 30.9 g/dL (ref 30.0–36.0)
MCV: 80.4 fL (ref 80.0–100.0)
Monocytes Absolute: 0.8 10*3/uL (ref 0.1–1.0)
Monocytes Relative: 11 %
Neutro Abs: 3.8 10*3/uL (ref 1.7–7.7)
Neutrophils Relative %: 51 %
Platelets: 394 10*3/uL (ref 150–400)
RBC: 5.15 MIL/uL — ABNORMAL HIGH (ref 3.87–5.11)
RDW: 15.8 % — ABNORMAL HIGH (ref 11.5–15.5)
WBC: 7.3 10*3/uL (ref 4.0–10.5)
nRBC: 0 % (ref 0.0–0.2)

## 2021-08-30 LAB — I-STAT BETA HCG BLOOD, ED (MC, WL, AP ONLY): I-stat hCG, quantitative: <5 m[IU]/mL (ref <5)

## 2021-08-30 IMAGING — CT CT VENOGRAM HEAD
3 of 11 series · 7 of 47 positions shown · non-contrast
Comparison: Prior study from [DATE]

CLINICAL DATA: Initial evaluation for acute headache, papilledema.

EXAM:
CT VENOGRAM HEAD
TECHNIQUE: Venographic phase images of the brain were obtained following the
administration of intravenous contrast. Multiplanar reformats and
maximum intensity projections were generated.

[Series 7: head wo 3.0 mpr cor · coronal · 0.33mm/px · 1 of 70 slices shown]
[im 35/70  brain]
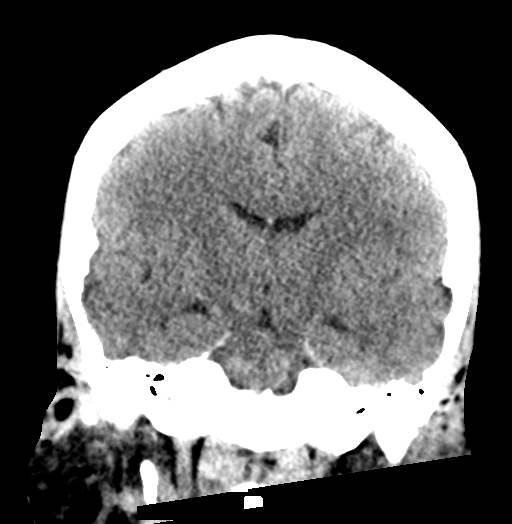

[Series 8: head wo 3.0 mpr sag · sagittal · 0.34mm/px · 1 of 54 slices shown]
[im 6/54  brain]
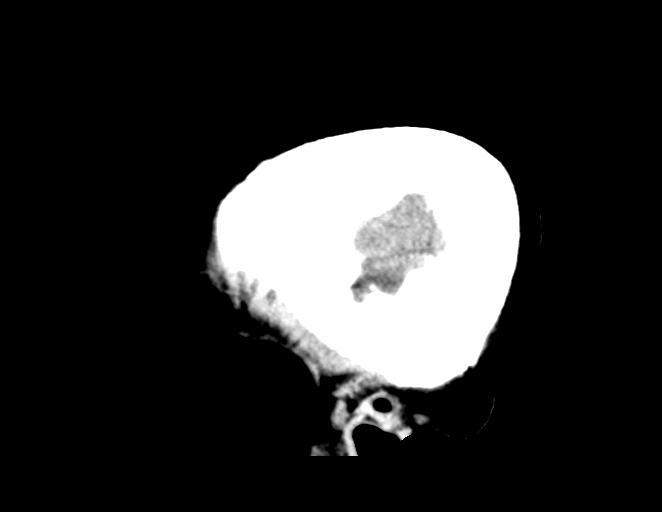

[Series 9: head with 2.0 h30s · axial · 0.46mm/px · z∈[-91,+21]mm · 5 of 84 slices shown]
[im 14/84  brain]
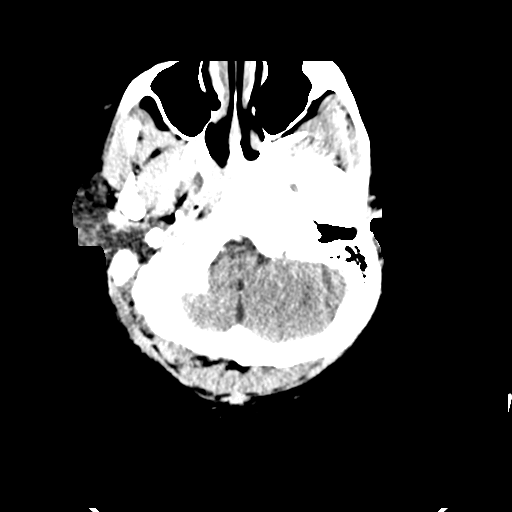
[im 28/84  bone]
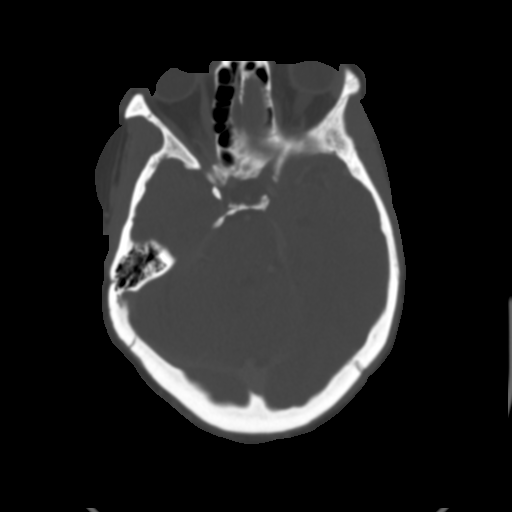
[im 42/84  brain]
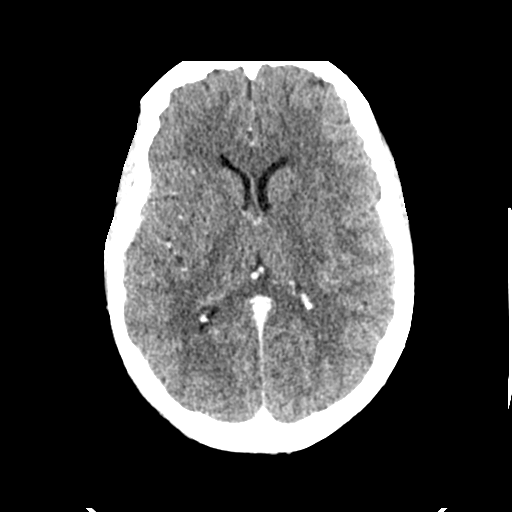
[im 56/84  bone]
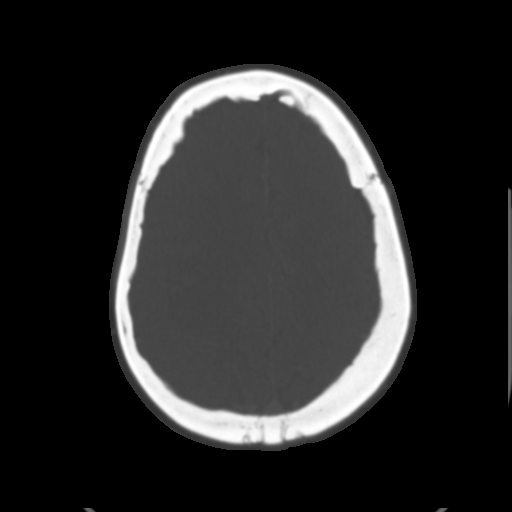
[im 70/84  brain]
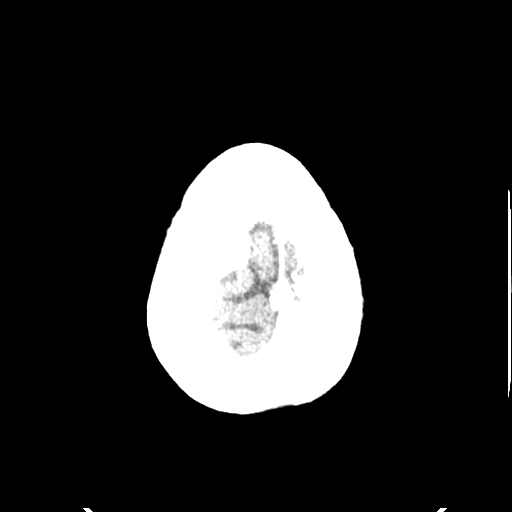

[7 of 47 positions shown; findings below may reference images not displayed]

RADIATION DOSE REDUCTION: This exam was performed according to the
departmental dose-optimization program which includes automated
exposure control, adjustment of the mA and/or kV according to
patient size and/or use of iterative reconstruction technique.

CONTRAST:  75mL OMNIPAQUE IOHEXOL 350 MG/ML SOLN
FINDINGS: Brain: Cerebral volume within normal limits for patient age. Patchy
nonspecific hypodensities seen involving the supratentorial cerebral
white matter, grossly similar to previous MRI.

No evidence for acute intracranial hemorrhage. No findings to
suggest acute large vessel territory infarct. No mass lesion,
midline shift, or mass effect. Ventricles are normal in size without
evidence for hydrocephalus. No extra-axial fluid collection
identified. Empty sella noted.

Vascular: No hyperdense vessel seen prior to contrast
administration. Following contrast administration, normal
enhancement seen throughout the superior sagittal sinus to the
torcula. Transverse and sigmoid sinuses are patent as are the
visualized proximal internal jugular veins. Straight sinus, vein of
NIYUKURI, internal cerebral veins, and basal veins of NIYUKURI are
patent. No abnormality about the cavernous sinus. Superior orbital
veins symmetric and normal. No cortical vein thrombosis.

Skull: Scalp soft tissues demonstrate no acute abnormality.
Calvarium intact.

Sinuses/Orbits: Globes and orbital soft tissues within normal
limits.

Visualized paranasal sinuses are clear. No mastoid effusion.
IMPRESSION: 1. Negative CT venogram. No evidence for dural venous sinus
thrombosis.
2. No other acute intracranial abnormality.
3. Empty sella. While this finding is often incidental in nature,
this can also be seen in the setting of idiopathic intracranial
hypertension.

## 2021-08-30 MED ORDER — IOHEXOL 350 MG/ML SOLN
75.0000 mL | Freq: Once | INTRAVENOUS | Status: AC | PRN
  Administered 2021-08-30: 75 mL via INTRAVENOUS

## 2021-08-30 MED ORDER — KETOROLAC TROMETHAMINE 15 MG/ML IJ SOLN
15.0000 mg | Freq: Once | INTRAMUSCULAR | Status: AC
  Administered 2021-08-30: 15 mg via INTRAVENOUS
  Filled 2021-08-30: qty 1

## 2021-08-30 MED ORDER — PROCHLORPERAZINE EDISYLATE 10 MG/2ML IJ SOLN
10.0000 mg | Freq: Once | INTRAMUSCULAR | Status: AC
  Administered 2021-08-30: 10 mg via INTRAVENOUS
  Filled 2021-08-30: qty 2

## 2021-08-30 NOTE — Telephone Encounter (Signed)
Pt called stating that she is still getting headaches and she is wanting to be advised on what she can do to alleviate the pain. Pt states she is taking Tylenol for the pain along with the medication that was given to her acetaZOLAMIDE (DIAMOX) 250 MG tablet ?Please advise.  ?

## 2021-08-30 NOTE — Telephone Encounter (Signed)
Called patient and reviewed Dr Quentin Mulling message with her. I advised her that vision issues due to papilledema can cause permanent vision changes and loss. She  verbalized understanding, appreciation. ? ?

## 2021-08-30 NOTE — ED Provider Notes (Signed)
?[facility] EMERGENCY DEPARTMENT ?Provider Note ? ? ?CSN: [id number] ?Arrival date & time: 08/30/21  1523 ? ?  ? ?History ? ?Chief Complaint  ?Patient presents with  ? Headache  ? ? ?Gina Wu is a 46 y.o. female. ? ?Patient presents to ER chief complaint of increased frequency of headaches.  She states the pain is in the back of the head mostly and sometimes in the front of the head as well.  She states has been having this for over a month.  She has been followed by her neurologist who advised her to come into the ER for further evaluation.  She states that she also has had blurry vision to the right eye that is intermittent.  Currently denies any blurry vision.  She has been on Diamox and advised by her doctor to increase the dosage.  Otherwise denies any fevers or cough or vomiting or diarrhea. ? ? ?  ? ?Home Medications ?Prior to Admission medications   ?Medication Sig Start Date End Date Taking? Authorizing Provider  ?acetaZOLAMIDE (DIAMOX) 250 MG tablet Take 250 mg twice a day for one week, then increase to 250 mg in the morning and 500 mg at bedtime for one week, then increase to 500 mg twice a day 08/24/21   Ocie Doyne, MD  ?hydrochlorothiazide (HYDRODIURIL) 25 MG tablet Take 25 mg by mouth daily. 04/08/21   [provider]  ?losartan (COZAAR) 100 MG tablet Take 100 mg by mouth daily. 04/08/21   [provider]  ?   ? ?Allergies    ?Patient has no known allergies.   ? ?Review of Systems   ?Review of Systems  ?Constitutional:  Negative for fever.  ?HENT:  Negative for ear pain.   ?Eyes:  Negative for pain.  ?Respiratory:  Negative for cough.   ?Cardiovascular:  Negative for chest pain.  ?Gastrointestinal:  Negative for abdominal pain.  ?Genitourinary:  Negative for flank pain.  ?Musculoskeletal:  Negative for back pain.  ?Skin:  Negative for rash.  ?Neurological:  Positive for headaches.  ? ?Physical Exam ?Updated Vital Signs ?BP 118/77 (BP Location: Right Arm)    Pulse 68   Temp 98.7 ?F (37.1 ?C) (Oral)   Resp 15   LMP 08/16/2021   SpO2 100%  ?Physical Exam ?Constitutional:   ?   General: She is not in acute distress. ?   Appearance: Normal appearance.  ?HENT:  ?   Head: Normocephalic.  ?   Nose: Nose normal.  ?Eyes:  ?   Extraocular Movements: Extraocular movements intact.  ?Cardiovascular:  ?   Rate and Rhythm: Normal rate.  ?Pulmonary:  ?   Effort: Pulmonary effort is normal.  ?Musculoskeletal:     ?   General: Normal range of motion.  ?   Cervical back: Normal range of motion.  ?Neurological:  ?   General: No focal deficit present.  ?   Mental Status: She is alert. Mental status is at baseline.  ?   Cranial Nerves: No cranial nerve deficit.  ?   Motor: No weakness.  ?   Gait: Gait normal.  ? ? ?ED Results / Procedures / Treatments   ?Labs ?(all labs ordered are listed, but only abnormal results are displayed) ?Labs Reviewed  ?BASIC METABOLIC PANEL - Abnormal; Notable for the following components:  ?    Result Value  ? CO2 18 (*)   ? All other components within normal limits  ?CBC WITH DIFFERENTIAL/PLATELET - Abnormal; Notable for the  following components:  ? RBC 5.15 (*)   ? MCH 24.9 (*)   ? RDW 15.8 (*)   ? All other components within normal limits  ?I-STAT BETA HCG BLOOD, ED ([facility], [facility], [facility] ONLY)  ? ? ?EKG ?None ? ?Radiology ?CT VENOGRAM HEAD ? ?Result Date: 08/30/2021 ?CLINICAL DATA:  Initial evaluation for acute headache, papilledema. EXAM: CT VENOGRAM HEAD TECHNIQUE: Venographic phase images of the brain were obtained following the administration of intravenous contrast. Multiplanar reformats and maximum intensity projections were generated. RADIATION DOSE REDUCTION: This exam was performed according to the departmental dose-optimization program which includes automated exposure control, adjustment of the mA and/or kV according to patient size and/or use of iterative reconstruction technique. CONTRAST:  75mL OMNIPAQUE IOHEXOL 350 MG/ML SOLN COMPARISON:  Prior study  from 04/10/2021 FINDINGS: Brain: Cerebral volume within normal limits for patient age. Patchy nonspecific hypodensities seen involving the supratentorial cerebral white matter, grossly similar to previous MRI. No evidence for acute intracranial hemorrhage. No findings to suggest acute large vessel territory infarct. No mass lesion, midline shift, or mass effect. Ventricles are normal in size without evidence for hydrocephalus. No extra-axial fluid collection identified. Empty sella noted. Vascular: No hyperdense vessel seen prior to contrast administration. Following contrast administration, normal enhancement seen throughout the superior sagittal sinus to the torcula. Transverse and sigmoid sinuses are patent as are the visualized proximal internal jugular veins. Straight sinus, vein of Galen, internal cerebral veins, and basal veins of Rosenthal are patent. No abnormality about the cavernous sinus. Superior orbital veins symmetric and normal. No cortical vein thrombosis. Skull: Scalp soft tissues demonstrate no acute abnormality. Calvarium intact. Sinuses/Orbits: Globes and orbital soft tissues within normal limits. Visualized paranasal sinuses are clear. No mastoid effusion. IMPRESSION: 1. Negative CT venogram. No evidence for dural venous sinus thrombosis. 2. No other acute intracranial abnormality. 3. Empty sella. While this finding is often incidental in nature, this can also be seen in the setting of idiopathic intracranial hypertension. Electronically Signed   By: Rise MuBenjamin  McClintock M.D.   On: 08/30/2021 19:24   ? ?Procedures ?Procedures  ? ? ?Medications Ordered in ED ?Medications  ?ketorolac (TORADOL) 15 MG/ML injection 15 mg (15 mg Intravenous Given 08/30/21 1808)  ?prochlorperazine (COMPAZINE) injection 10 mg (10 mg Intravenous Given 08/30/21 1804)  ?iohexol (OMNIPAQUE) 350 MG/ML injection 75 mL (75 mLs Intravenous Contrast Given 08/30/21 1858)  ? ? ?ED Course/ Medical Decision Making/ A&P ?  ?                         ?Medical Decision Making ?Risk ?Prescription drug management. ? ? ?Chart review shows office visit with her neurologist August 24, 2021. ? ?On cardiac monitoring showing sinus rhythm. ? ?Work-up included labs CBC chemistry urine.  Which was negative. ? ?CT venogram performed with no evidence of thrombus no acute findings noted. ? ?Patient given Toradol and Compazine with subsequent resolution of headache.  Currently pain-free.  She has no visual changes although they have been present in the past. ? ?We will recommend continued work-up and follow-up with her neurologist on an outpatient basis.  Advised her to call her neurologist tomorrow morning, and continue her increased dose of Diamox.  Patient expressed understanding discharged home stable condition. ? ? ? ? ? ? ? ? ? ?Final Clinical Impression(s) / ED Diagnoses ?Final diagnoses:  ?Nonintractable headache, unspecified chronicity pattern, unspecified headache type  ? ? ?Rx / DC Orders ?ED Discharge Orders   ? ?  None  ? ?  ? ? ?  ?Cheryll Cockayne, MD ?08/30/21 1949 ? ?

## 2021-08-30 NOTE — Discharge Instructions (Signed)
Call your primary care doctor or specialist as discussed in the next 2-3 days.   Return immediately back to the ER if:  Your symptoms worsen within the next 12-24 hours. You develop new symptoms such as new fevers, persistent vomiting, new pain, shortness of breath, or new weakness or numbness, or if you have any other concerns.  

## 2021-08-30 NOTE — Telephone Encounter (Signed)
Yes she can increase to 500 mg BID now and go to ED for expedited workup. Thanks

## 2021-08-30 NOTE — ED Notes (Signed)
Patient transported to CT 

## 2021-08-30 NOTE — ED Triage Notes (Signed)
Pt sent by her neurologist, c/o frequent headaches. Pt recently started on new headache medication and scheduled for CT scan on Wednesday. Pt states headaches are more frequent. C/o light sensitivity. ?

## 2021-08-30 NOTE — ED Provider Triage Note (Signed)
Emergency Medicine Provider Triage Evaluation Note ? ?Gina Wu , a 46 y.o. female  was evaluated in triage.  Pt complains of headaches and blurry vision to right eye.  Patient reports that she has had worsening headaches over the last month.  Blurry vision has been present to right eye over the last month.  Patient is currently taking Diamox for hyperlipidemia and is followed by neurology.  Was advised to increase her Diamox and come to the emergency department for further evaluation.  Patient endorses numbness to toes. ? ?Review of Systems  ?Positive: Headache, blurry vision in right eye, numbness ?Negative: Facial asymmetry, dysarthria, weakness, diplopia ? ?Physical Exam  ?BP 135/81   Pulse 77   Temp 98.7 ?F (37.1 ?C) (Oral)   Resp 14   LMP 08/16/2021   SpO2 98%  ?Gen:   Awake, no distress   ?Resp:  Normal effort  ?MSK:   Moves extremities without difficulty  ?Other:  EOM intact bilaterally.  Pupils PERRL.  Grip strength equal.  No dysarthria. ? ?Medical Decision Making  ?Medically screening exam initiated at 3:54 PM.  Appropriate orders placed.  Gina Wu was informed that the remainder of the evaluation will be completed by another provider, this initial triage assessment does not replace that evaluation, and the importance of remaining in the ED until their evaluation is complete. ? ?Patient was scheduled to receive CT venogram due to her headaches and papilledema.  I have ordered this imaging today. ?  ?Haskel Schroeder, PA-C ?08/30/21 1556 ? ?

## 2021-08-30 NOTE — Telephone Encounter (Signed)
Called patient; she's taking  diamox 250 mg twice daily, due to increase to 500 mg twice daily tomorrow. Headaches came back aggressively, still sees cloud in right eye but stated vision is not worse that it has been.  Her CTV is Wed. I advised will send to MD and call her back. Patient verbalized understanding, appreciation. ?  ?

## 2021-09-01 ENCOUNTER — Other Ambulatory Visit: Payer: No Typology Code available for payment source

## 2021-09-01 ENCOUNTER — Inpatient Hospital Stay: Admission: RE | Admit: 2021-09-01 | Payer: No Typology Code available for payment source | Source: Ambulatory Visit

## 2021-09-16 ENCOUNTER — Other Ambulatory Visit: Payer: Self-pay | Admitting: Psychiatry

## 2021-09-16 ENCOUNTER — Telehealth: Payer: Self-pay | Admitting: Psychiatry

## 2021-09-16 DIAGNOSIS — H4710 Unspecified papilledema: Secondary | ICD-10-CM

## 2021-09-16 NOTE — Telephone Encounter (Signed)
Called patient who is asking if she needs spinal tap since she went to ED and had CT done which was normal. She stated she understood that a spinal tap would be the next step. She is taking Diamox 500 mg twice a day, not really having any new symptoms, had a headache yesterday. I advised will send to Dr Billey Gosling and let her know. Patient verbalized understanding, appreciation. ?  ?

## 2021-09-16 NOTE — Telephone Encounter (Signed)
Pt would like a call from the nurse to discuss next step of treatment for a ongoing issue after last OV. Pt did not want elaborate on the issue. Pt said this is urgent and have not heard from anyone. ?

## 2021-09-16 NOTE — Telephone Encounter (Signed)
I placed an order for her to get an LP, thanks

## 2021-09-16 NOTE — Telephone Encounter (Signed)
I sent  Gina Wu with [facility] a message to get pt scheduled for a Lumbar Puncture. ?

## 2021-09-16 NOTE — Telephone Encounter (Signed)
Called patient and informed her Dr Delena Bali ordered LP. She can expect a call from [facility] to schedule it. Patient verbalized understanding, appreciation. ? ?

## 2021-09-24 ENCOUNTER — Ambulatory Visit
Admission: RE | Admit: 2021-09-24 | Discharge: 2021-09-24 | Disposition: A | Discharge status: Home / Self Care | Payer: No Typology Code available for payment source | Source: Ambulatory Visit | Attending: Psychiatry | Admitting: Psychiatry

## 2021-09-24 VITALS — BP 111/59 | HR 58

## 2021-09-24 DIAGNOSIS — H4710 Unspecified papilledema: Secondary | ICD-10-CM

## 2021-09-24 DIAGNOSIS — I10 Essential (primary) hypertension: Secondary | ICD-10-CM

## 2021-09-24 LAB — CSF CELL COUNT WITH DIFFERENTIAL
RBC Count, CSF: 0 cells/uL
TOTAL NUCLEATED CELL: 2 cells/uL (ref 0–5)

## 2021-09-24 LAB — GLUCOSE, CSF: Glucose, CSF: 58 mg/dL (ref 40–80)

## 2021-09-24 LAB — PROTEIN, CSF: Total Protein, CSF: 39 mg/dL (ref 15–45)

## 2021-09-24 IMAGING — XA DG SPINAL PUNCT LUMBAR DIAG WITH FL CT GUIDANCE
1 series · 1 of 1 positions shown · non-contrast
Comparison: None.

CLINICAL DATA: Papilledema.  Idiopathic intracranial hypertension.

EXAM:
DIAGNOSTIC LUMBAR PUNCTURE UNDER FLUOROSCOPIC GUIDANCE

[Series 1: ortho standard · 1 of 1 slices shown]
[im 1/1]
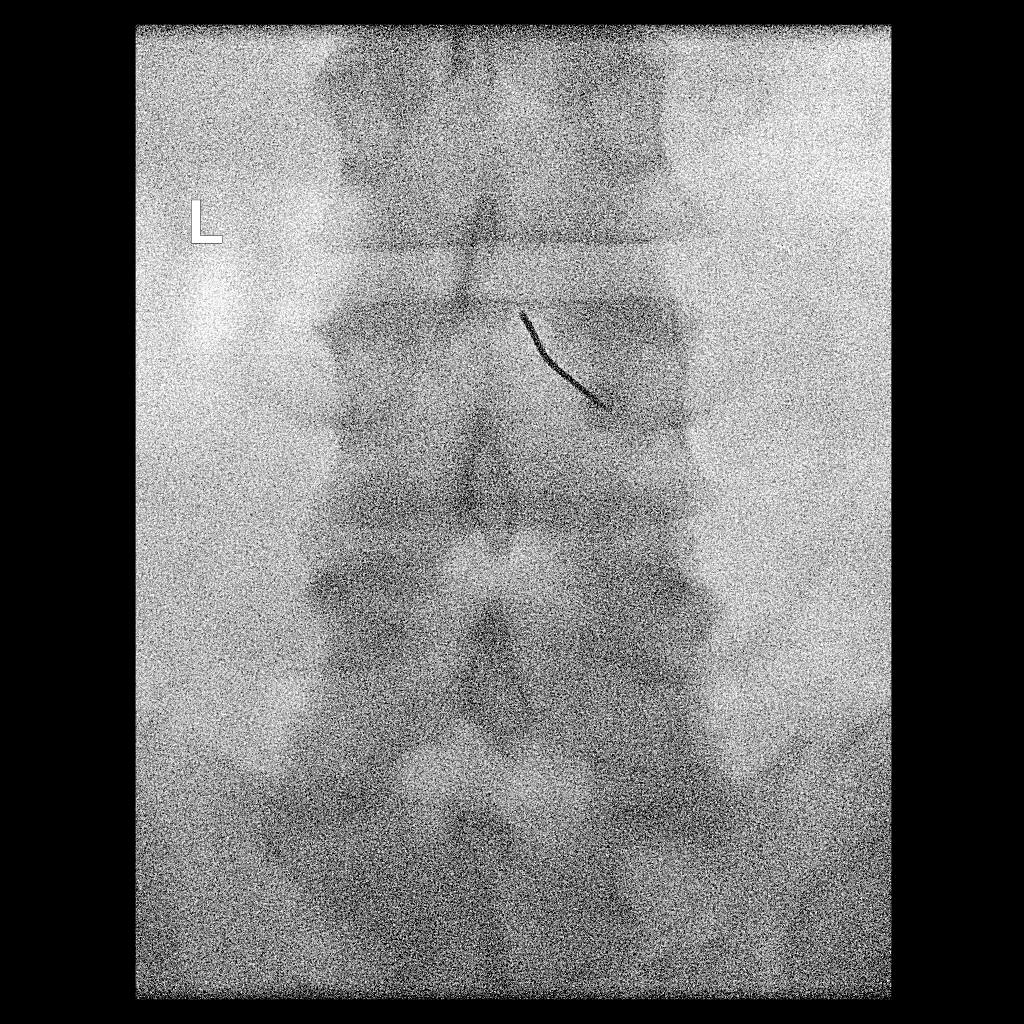

[1 of 1 positions shown; findings below may reference images not displayed]

FLUOROSCOPY:
Radiation Exposure Index (as provided by the fluoroscopic device):
0.90 mGy Kerma

PROCEDURE:
Informed consent was obtained from the patient prior to the
procedure, including potential complications of headache, allergy,
and pain. With the patient prone, the lower back was prepped with
Betadine. 1% Lidocaine was used for local anesthesia. Lumbar
puncture was performed at the L3-4 level using a 6 inch 20 gauge
needle via a right interlaminar approach with return of clear CSF
with an opening pressure of 18 cm water (measured in the left
lateral decubitus position). 5 mL of CSF were obtained for
laboratory studies. Closing pressure was 15 cm water. The patient
tolerated the procedure well and there were no apparent
complications.
IMPRESSION: Technically successful fluoroscopically guided lumbar puncture.
Opening pressure of 18 cm water.

## 2021-09-24 NOTE — Discharge Instructions (Signed)

## 2021-11-19 ENCOUNTER — Other Ambulatory Visit: Payer: Self-pay | Admitting: Psychiatry

## 2021-12-06 ENCOUNTER — Ambulatory Visit: Payer: BLUE CROSS/BLUE SHIELD | Admitting: Psychiatry

## 2022-01-05 ENCOUNTER — Ambulatory Visit (INDEPENDENT_AMBULATORY_CARE_PROVIDER_SITE_OTHER): Payer: No Typology Code available for payment source | Admitting: Psychiatry

## 2022-01-05 VITALS — BP 138/88 | HR 61 | Ht 65.0 in | Wt 251.5 lb

## 2022-01-05 DIAGNOSIS — H4710 Unspecified papilledema: Secondary | ICD-10-CM

## 2022-01-05 NOTE — Progress Notes (Signed)
   CC:  headaches  Follow-up Visit  Last visit: 08/24/21  Brief HPI: 46 year old female with a history of HTN who follows in clinic for headaches and papilledema.  At her last visit Diamox 500 mg BID was started due to worsening vision issues. LP and CTV were ordered.   Interval History: CTV was normal. LP 09/24/21 showed a normal opening pressure of 18. Of note she had been on Diamox for ~3 weeks prior to LP. Headaches have improved. Occasionally will get a mild headache related to stress which resolves with Tylenol. Vision has improved.  Last saw ophthalmology in April who noted improvement in papilledema but it had not fully resolved.   Current Headache Regimen: Preventative: Diamox 500 mg BID Abortive: Tylenol   Prior Therapies                                  Diamox 500 mg BID  Physical Exam:   Vital Signs: BP 138/88   Pulse 61   Ht 5\' 5"  (1.651 m)   Wt 251 lb 8 oz (114.1 kg)   BMI 41.85 kg/m  GENERAL:  well appearing, in no acute distress, alert  SKIN:  Color, texture, turgor normal. No rashes or lesions HEAD:  Normocephalic/atraumatic. RESP: normal respiratory effort MSK:  No gross joint deformities.   NEUROLOGICAL: Mental Status: Alert, oriented to person, place and time, Follows commands, and Speech fluent and appropriate. Cranial Nerves: PERRL, face symmetric, no dysarthria, hearing grossly intact Motor: moves all extremities equally Gait: normal-based.  IMPRESSION: 46 year old female with a history of HTN who presents for follow up of headaches and papilledema. LP showed a normal opening pressure, however she had been on Diamox for 3 weeks prior to LP. Optic discs look improved today. Will stop Diamox today given normal opening pressure on LP and resolution of symptoms. She will follow up with her ophthalmologist in October.  PLAN: -Stop Diamox -Follow up with ophthalmology in October 2023   Follow-up: 7 months  I spent a total of 21 minutes on the  date of the service. Headache education was done. Discussed medication side effects, adverse reactions and drug interactions. Written educational materials and patient instructions outlining all of the above were given.  November 2023, MD 01/05/22 1:48 PM

## 2022-01-05 NOTE — Patient Instructions (Signed)
Decrease acetazolamide to 2 pills daily for 1 week, then stop

## 2022-08-15 ENCOUNTER — Ambulatory Visit: Payer: No Typology Code available for payment source | Admitting: Psychiatry

## 2022-09-06 ENCOUNTER — Ambulatory Visit: Payer: No Typology Code available for payment source | Admitting: Psychiatry

## 2022-10-26 ENCOUNTER — Ambulatory Visit (INDEPENDENT_AMBULATORY_CARE_PROVIDER_SITE_OTHER): Payer: No Typology Code available for payment source | Admitting: Podiatry

## 2022-10-26 ENCOUNTER — Encounter: Payer: Self-pay | Admitting: Podiatry

## 2022-10-26 DIAGNOSIS — L600 Ingrowing nail: Secondary | ICD-10-CM

## 2022-10-26 NOTE — Progress Notes (Signed)
  Subjective:  Patient ID: Gina Wu, female    DOB: 20-Jul-1975,   MRN: [id number]  Chief Complaint  Patient presents with   Ingrown Toenail    RM 21 Bilateral ingrown x 1 year off and on. Lateral hallux. Soreness w/o drainage or bleeding.     47 y.o. female presents for concern of bilateral great ingrown toenails that have been present on and off for a year. Relates some tenderness today and mostly has salon trim nails back and wanted them checked.   . Denies any other pedal complaints. Denies n/v/f/c.   Past Medical History:  Diagnosis Date   Hypertension     Objective:  Physical Exam: Vascular: DP/PT pulses 2/4 bilateral. CFT <3 seconds. Normal hair growth on digits. No edema.  Skin. No lacerations or abrasions bilateral feet. Bilateral incurvation of bilateral borders of great toes. Mild tenderness. No erythema edema or purulence noted.  Musculoskeletal: MMT 5/5 bilateral lower extremities in DF, PF, Inversion and Eversion. Deceased ROM in DF of ankle joint.  Neurological: Sensation intact to light touch.   Assessment:   1. Ingrown right greater toenail   2. Ingrowing left great toenail      Plan:  Patient was evaluated and treated and all questions answered. Discussed ingrown toenails etiology and treatment options including procedure for removal vs conservative care.  Patient would like to hold off on procedure today.  Did debrided bilateral hallux nails in slant back fashion to patient comfort  Return as needed for procedure.    Louann Sjogren, DPM

## 2024-04-11 ENCOUNTER — Other Ambulatory Visit (INDEPENDENT_AMBULATORY_CARE_PROVIDER_SITE_OTHER): Payer: Self-pay | Admitting: Vascular Surgery

## 2024-04-11 DIAGNOSIS — I83813 Varicose veins of bilateral lower extremities with pain: Secondary | ICD-10-CM

## 2024-04-12 ENCOUNTER — Other Ambulatory Visit (INDEPENDENT_AMBULATORY_CARE_PROVIDER_SITE_OTHER)

## 2024-04-12 ENCOUNTER — Ambulatory Visit (INDEPENDENT_AMBULATORY_CARE_PROVIDER_SITE_OTHER): Admitting: Vascular Surgery

## 2024-04-12 ENCOUNTER — Encounter (INDEPENDENT_AMBULATORY_CARE_PROVIDER_SITE_OTHER): Payer: Self-pay | Admitting: Vascular Surgery

## 2024-04-12 VITALS — BP 114/82 | HR 78 | Resp 18 | Ht 65.0 in | Wt 194.8 lb

## 2024-04-12 DIAGNOSIS — I10 Essential (primary) hypertension: Secondary | ICD-10-CM

## 2024-04-12 DIAGNOSIS — I83813 Varicose veins of bilateral lower extremities with pain: Secondary | ICD-10-CM

## 2024-04-12 DIAGNOSIS — M79609 Pain in unspecified limb: Secondary | ICD-10-CM | POA: Insufficient documentation

## 2024-04-12 DIAGNOSIS — M79604 Pain in right leg: Secondary | ICD-10-CM | POA: Diagnosis not present

## 2024-04-12 DIAGNOSIS — M79605 Pain in left leg: Secondary | ICD-10-CM

## 2024-04-12 NOTE — Assessment & Plan Note (Signed)
 a venous reflux study was performed today.  This demonstrates no evidence of deep venous thrombosis, superficial thrombophlebitis, or significant venous reflux in either lower extremity.  It does not appear as if vascular disease is the cause of her lower extremity symptoms.  I would suspect a neuropathic cause of her pain.  I will see her back as needed.  If she desires sclerotherapy for her superficial varicosities, we do not do cosmetic sclerotherapy and I have recommended she see a dermatologist.

## 2024-04-12 NOTE — Progress Notes (Signed)
 Patient ID: Gina Wu, female   DOB: 05/07/1976, 48 y.o.   MRN: [id number]  Chief Complaint  Patient presents with   Establish Care    New patient BLE reflux + consult bilateral VV w/ pain in thighs increased number and size of VV     HPI Gina Wu is a 48 y.o. female.  I am asked to see the patient by Dr. 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  Patient has low back issues and has had more difficulty with pain on the outside of her legs.  She says she can walk a good 15 minutes before having to stop and rest because of leg pain.  It is more positional such as standing or laying flat that causes her pain.  This pain radiates down from her outer thigh and hip area down her legs.  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  She was concerned about venous disease as a possible contributing factor so a venous reflux study was performed today.  This demonstrates no evidence of deep venous thrombosis, superficial thrombophlebitis, or significant venous reflux in either lower extremity.     Past Medical History:  Diagnosis Date   Hypertension     Past Surgical History:  Procedure Laterality Date   BREAST REDUCTION SURGERY       Family History  Problem Relation Age of Onset   Cataracts Mother       Social History   Tobacco Use   Smoking status: Never   Smokeless tobacco: Never  Substance Use Topics   Alcohol use: Not Currently   Drug use: Never     No Known Allergies  Current Outpatient Medications  Medication Sig Dispense Refill   hydrochlorothiazide (HYDRODIURIL) 25 MG tablet Take 25 mg by mouth daily.     losartan (COZAAR) 100 MG tablet Take 100 mg by mouth daily.     acetaZOLAMIDE  (DIAMOX ) 250 MG tablet Take 2 tablets (500 mg total) by mouth 2 (two) times daily. Maintenance dose 360 tablet 1   No current facility-administered medications  for this visit.      REVIEW OF SYSTEMS (Negative unless checked)  Constitutional: [] Weight loss  [] Fever  [] Chills Cardiac: [] Chest pain   [] Chest pressure   [] Palpitations   [] Shortness of breath when laying flat   [] Shortness of breath at rest   [] Shortness of breath with exertion. Vascular:  [] Pain in legs with walking   [x] Pain in legs at rest   [x] Pain in legs when laying flat   [] Claudication   [] Pain in feet when walking  [] Pain in feet at rest  [] Pain in feet when laying flat   [] History of DVT   [] Phlebitis   [] Swelling in legs   [x] Varicose veins   [] Non-healing ulcers Pulmonary:   [] Uses home oxygen   [] Productive cough   [] Hemoptysis   [] Wheeze  [] COPD   [] Asthma Neurologic:  [] Dizziness  [] Blackouts   [] Seizures   [] History of stroke   [] History of TIA  [] Aphasia   [] Temporary blindness   [] Dysphagia   [] Weakness or numbness in arms   [] Weakness or numbness in legs Musculoskeletal:  [] Arthritis   [] Joint swelling   [] Joint pain   [] Low back pain Hematologic:  [] Easy bruising  [] Easy bleeding   [] Hypercoagulable state   [] Anemic  [] Hepatitis Gastrointestinal:  [] Blood in stool   [] Vomiting blood  [] Gastroesophageal reflux/heartburn   []   Abdominal pain Genitourinary:  [] Chronic kidney disease   [] Difficult urination  [] Frequent urination  [] Burning with urination   [] Hematuria Skin:  [] Rashes   [] Ulcers   [] Wounds Psychological:  [] History of anxiety   []  History of major depression.    Physical Exam BP 114/82 (BP Location: Left Arm, Patient Position: Sitting, Cuff Size: Large)   Pulse 78   Resp 18   Ht 5' 5 (1.651 m)   Wt 194 lb 12.8 oz (88.4 kg)   BMI 32.42 kg/m  Gen:  WD/WN, NAD Head: [place]/AT, No temporalis wasting.  Ear/Nose/Throat: Hearing grossly intact, nares w/o erythema or drainage, oropharynx w/o Erythema/Exudate Eyes: Conjunctiva clear, sclera non-icteric  Neck: trachea midline.  No JVD.  Pulmonary:  Good air movement, respirations not labored, no use of  accessory muscles  Cardiac: RRR, no JVD Vascular: scattered superficial varicosities bilaterally. Vessel Right Left  Radial Palpable Palpable                          DP Palpable  Palpable   PT Palpable  Palpable    Gastrointestinal:. No masses, surgical incisions, or scars. Musculoskeletal: M/S 5/5 throughout.  Extremities without ischemic changes.  No deformity or atrophy. No edema. Neurologic: Sensation grossly intact in extremities.  Symmetrical.  Speech is fluent. Motor exam as listed above. Psychiatric: Judgment intact, Mood & affect appropriate for pt's clinical situation. Dermatologic: No rashes or ulcers noted.  No cellulitis or open wounds.    Radiology No results found.  Labs No results found for this or any previous visit (from the past 2160 hours).  Assessment/Plan:  Pain in limb a venous reflux study was performed today.  This demonstrates no evidence of deep venous thrombosis, superficial thrombophlebitis, or significant venous reflux in either lower extremity.  It does not appear as if vascular disease is the cause of her lower extremity symptoms.  I would suspect a neuropathic cause of her pain.  I will see her back as needed.  If she desires sclerotherapy for her superficial varicosities, we do not do cosmetic sclerotherapy and I have recommended she see a dermatologist.  HTN (hypertension) blood pressure control important in reducing the progression of atherosclerotic disease. On appropriate oral medications.      Gina Wu 04/12/2024, 2:01 PM   This note was created with Dragon medical transcription system.  Any errors from dictation are unintentional.

## 2024-04-12 NOTE — Assessment & Plan Note (Signed)
 blood pressure control important in reducing the progression of atherosclerotic disease. On appropriate oral medications.

## 2024-05-24 ENCOUNTER — Encounter (INDEPENDENT_AMBULATORY_CARE_PROVIDER_SITE_OTHER)

## 2024-05-24 ENCOUNTER — Encounter (INDEPENDENT_AMBULATORY_CARE_PROVIDER_SITE_OTHER): Admitting: Vascular Surgery
